# Patient Record
Sex: Female | Born: 1975 | Hispanic: Yes | Marital: Single | Smoking: Never smoker
Health system: Southern US, Community
[De-identification: ages and names within clinical notes are randomized; demographics above are authoritative.]

## PROBLEM LIST (undated history)

## (undated) ENCOUNTER — Inpatient Hospital Stay (HOSPITAL_COMMUNITY): Payer: Self-pay

## (undated) DIAGNOSIS — J45909 Unspecified asthma, uncomplicated: Secondary | ICD-10-CM

## (undated) DIAGNOSIS — E669 Obesity, unspecified: Secondary | ICD-10-CM

## (undated) DIAGNOSIS — N96 Recurrent pregnancy loss: Secondary | ICD-10-CM

## (undated) HISTORY — PX: WISDOM TOOTH EXTRACTION: SHX21

## (undated) HISTORY — PX: CHOLECYSTECTOMY: SHX55

## (undated) HISTORY — DX: Unspecified asthma, uncomplicated: J45.909

---

## 2010-10-17 ENCOUNTER — Emergency Department (HOSPITAL_COMMUNITY): Admission: EM | Admit: 2010-10-17 | Discharge: 2010-10-17 | Payer: Self-pay | Admitting: Emergency Medicine

## 2010-10-19 ENCOUNTER — Inpatient Hospital Stay (HOSPITAL_COMMUNITY): Admission: AD | Admit: 2010-10-19 | Discharge: 2010-10-19 | Payer: Self-pay | Admitting: Obstetrics & Gynecology

## 2010-10-21 ENCOUNTER — Ambulatory Visit (HOSPITAL_COMMUNITY): Admission: RE | Admit: 2010-10-21 | Discharge: 2010-10-21 | Payer: Self-pay | Admitting: Obstetrics

## 2010-10-23 ENCOUNTER — Ambulatory Visit (HOSPITAL_COMMUNITY): Admission: RE | Admit: 2010-10-23 | Discharge: 2010-10-23 | Payer: Self-pay | Admitting: Obstetrics & Gynecology

## 2010-10-25 ENCOUNTER — Ambulatory Visit (HOSPITAL_COMMUNITY): Admission: AD | Admit: 2010-10-25 | Discharge: 2010-10-25 | Payer: Self-pay | Admitting: Family Medicine

## 2010-12-12 ENCOUNTER — Emergency Department (HOSPITAL_COMMUNITY)
Admission: EM | Admit: 2010-12-12 | Discharge: 2010-12-12 | Payer: Self-pay | Source: Home / Self Care | Admitting: Emergency Medicine

## 2011-02-22 LAB — LIPASE, BLOOD: Lipase: 30 U/L (ref 11–59)

## 2011-02-22 LAB — URINALYSIS, ROUTINE W REFLEX MICROSCOPIC
Bilirubin Urine: NEGATIVE
Glucose, UA: NEGATIVE mg/dL
Ketones, ur: NEGATIVE mg/dL
Leukocytes, UA: NEGATIVE
Nitrite: NEGATIVE
Protein, ur: NEGATIVE mg/dL
Specific Gravity, Urine: 1.024 (ref 1.005–1.030)
Urobilinogen, UA: 0.2 mg/dL (ref 0.0–1.0)
pH: 7 (ref 5.0–8.0)

## 2011-02-22 LAB — CBC
HCT: 34.5 % — ABNORMAL LOW (ref 36.0–46.0)
Hemoglobin: 11 g/dL — ABNORMAL LOW (ref 12.0–15.0)
MCH: 26.1 pg (ref 26.0–34.0)
MCHC: 31.9 g/dL (ref 30.0–36.0)
MCV: 81.9 fL (ref 78.0–100.0)
Platelets: 289 10*3/uL (ref 150–400)
RBC: 4.21 MIL/uL (ref 3.87–5.11)
RDW: 13.3 % (ref 11.5–15.5)
WBC: 6.5 10*3/uL (ref 4.0–10.5)

## 2011-02-22 LAB — DIFFERENTIAL
Basophils Absolute: 0 10*3/uL (ref 0.0–0.1)
Basophils Relative: 0 % (ref 0–1)
Eosinophils Absolute: 0.1 10*3/uL (ref 0.0–0.7)
Eosinophils Relative: 1 % (ref 0–5)
Lymphocytes Relative: 22 % (ref 12–46)
Lymphs Abs: 1.5 10*3/uL (ref 0.7–4.0)
Monocytes Absolute: 0.4 10*3/uL (ref 0.1–1.0)
Monocytes Relative: 6 % (ref 3–12)
Neutro Abs: 4.5 10*3/uL (ref 1.7–7.7)
Neutrophils Relative %: 70 % (ref 43–77)

## 2011-02-22 LAB — BASIC METABOLIC PANEL
BUN: 9 mg/dL (ref 6–23)
CO2: 27 mEq/L (ref 19–32)
Calcium: 9 mg/dL (ref 8.4–10.5)
Chloride: 110 mEq/L (ref 96–112)
Creatinine, Ser: 0.68 mg/dL (ref 0.4–1.2)
GFR calc Af Amer: >60 mL/min (ref >60)
GFR calc non Af Amer: >60 mL/min (ref >60)
Glucose, Bld: 96 mg/dL (ref 70–99)
Potassium: 4.1 mEq/L (ref 3.5–5.1)
Sodium: 140 mEq/L (ref 135–145)

## 2011-02-22 LAB — POCT PREGNANCY, URINE: Preg Test, Ur: NEGATIVE

## 2011-02-22 LAB — URINE MICROSCOPIC-ADD ON

## 2011-02-23 LAB — COMPREHENSIVE METABOLIC PANEL
ALT: 29 U/L (ref 0–35)
AST: 23 U/L (ref 0–37)
Albumin: 3.7 g/dL (ref 3.5–5.2)
Alkaline Phosphatase: 67 U/L (ref 39–117)
BUN: 9 mg/dL (ref 6–23)
CO2: 22 mEq/L (ref 19–32)
Calcium: 8.7 mg/dL (ref 8.4–10.5)
Chloride: 111 mEq/L (ref 96–112)
Creatinine, Ser: 0.5 mg/dL (ref 0.4–1.2)
GFR calc Af Amer: >60 mL/min (ref >60)
GFR calc non Af Amer: >60 mL/min (ref >60)
Glucose, Bld: 84 mg/dL (ref 70–99)
Potassium: 3.4 mEq/L — ABNORMAL LOW (ref 3.5–5.1)
Sodium: 137 mEq/L (ref 135–145)
Total Bilirubin: 0.2 mg/dL — ABNORMAL LOW (ref 0.3–1.2)
Total Protein: 6.1 g/dL (ref 6.0–8.3)

## 2011-02-23 LAB — HCG, QUANTITATIVE, PREGNANCY
hCG, Beta Chain, Quant, S: 134 m[IU]/mL — ABNORMAL HIGH (ref <5)
hCG, Beta Chain, Quant, S: 202 m[IU]/mL — ABNORMAL HIGH (ref <5)
hCG, Beta Chain, Quant, S: 254 m[IU]/mL — ABNORMAL HIGH (ref <5)
hCG, Beta Chain, Quant, S: 261 m[IU]/mL — ABNORMAL HIGH (ref <5)
hCG, Beta Chain, Quant, S: 269 m[IU]/mL — ABNORMAL HIGH (ref <5)

## 2011-02-23 LAB — CBC
HCT: 35.1 % — ABNORMAL LOW (ref 36.0–46.0)
HCT: 36.9 % (ref 36.0–46.0)
Hemoglobin: 12.1 g/dL (ref 12.0–15.0)
Hemoglobin: 12.6 g/dL (ref 12.0–15.0)
MCH: 28.4 pg (ref 26.0–34.0)
MCH: 29.2 pg (ref 26.0–34.0)
MCHC: 34.1 g/dL (ref 30.0–36.0)
MCHC: 34.4 g/dL (ref 30.0–36.0)
MCV: 83.3 fL (ref 78.0–100.0)
MCV: 84.8 fL (ref 78.0–100.0)
Platelets: 243 10*3/uL (ref 150–400)
Platelets: 255 10*3/uL (ref 150–400)
RBC: 4.14 MIL/uL (ref 3.87–5.11)
RBC: 4.43 MIL/uL (ref 3.87–5.11)
RDW: 13.8 % (ref 11.5–15.5)
RDW: 13.8 % (ref 11.5–15.5)
WBC: 8.8 10*3/uL (ref 4.0–10.5)
WBC: 9.3 10*3/uL (ref 4.0–10.5)

## 2011-02-23 LAB — DIFFERENTIAL
Basophils Absolute: 0 10*3/uL (ref 0.0–0.1)
Basophils Relative: 0 % (ref 0–1)
Eosinophils Absolute: 0.1 10*3/uL (ref 0.0–0.7)
Eosinophils Relative: 1 % (ref 0–5)
Lymphocytes Relative: 25 % (ref 12–46)
Lymphs Abs: 2.1 10*3/uL (ref 0.7–4.0)
Monocytes Absolute: 0.5 10*3/uL (ref 0.1–1.0)
Monocytes Relative: 6 % (ref 3–12)
Neutro Abs: 6 10*3/uL (ref 1.7–7.7)
Neutrophils Relative %: 68 % (ref 43–77)

## 2011-02-23 LAB — URINE MICROSCOPIC-ADD ON

## 2011-02-23 LAB — ABO/RH: ABO/RH(D): A POS

## 2011-02-23 LAB — LIPASE, BLOOD: Lipase: 27 U/L (ref 11–59)

## 2011-02-23 LAB — URINALYSIS, ROUTINE W REFLEX MICROSCOPIC
Bilirubin Urine: NEGATIVE
Glucose, UA: NEGATIVE mg/dL
Ketones, ur: NEGATIVE mg/dL
Leukocytes, UA: NEGATIVE
Nitrite: NEGATIVE
Protein, ur: NEGATIVE mg/dL
Specific Gravity, Urine: 1.025 (ref 1.005–1.030)
Urobilinogen, UA: 0.2 mg/dL (ref 0.0–1.0)
pH: 7.5 (ref 5.0–8.0)

## 2011-02-23 LAB — POCT PREGNANCY, URINE: Preg Test, Ur: POSITIVE

## 2011-12-14 NOTE — L&D Delivery Note (Signed)
Patient was C/C/+2 and pushed for about 45 minutes with epidural.   NSVD  female infant, Apgars 9,9, weight P.   The patient had one midline episiotomy repaired with 2-0 vicryl R. Fundus was firm. EBL was expected. Placenta was delivered intact. Vagina was clear.  Baby was vigorous to bedside.  Ashana Tullo A

## 2012-01-10 ENCOUNTER — Other Ambulatory Visit: Payer: Self-pay

## 2012-01-10 ENCOUNTER — Encounter (HOSPITAL_COMMUNITY): Payer: Self-pay | Admitting: *Deleted

## 2012-01-10 ENCOUNTER — Emergency Department (HOSPITAL_COMMUNITY)
Admission: EM | Admit: 2012-01-10 | Discharge: 2012-01-10 | Disposition: A | Discharge status: Home / Self Care | Payer: Medicaid Other | Attending: Emergency Medicine | Admitting: Emergency Medicine

## 2012-01-10 DIAGNOSIS — J45901 Unspecified asthma with (acute) exacerbation: Secondary | ICD-10-CM | POA: Insufficient documentation

## 2012-01-10 DIAGNOSIS — R059 Cough, unspecified: Secondary | ICD-10-CM | POA: Insufficient documentation

## 2012-01-10 DIAGNOSIS — E669 Obesity, unspecified: Secondary | ICD-10-CM | POA: Insufficient documentation

## 2012-01-10 DIAGNOSIS — R05 Cough: Secondary | ICD-10-CM | POA: Insufficient documentation

## 2012-01-10 DIAGNOSIS — Z331 Pregnant state, incidental: Secondary | ICD-10-CM | POA: Insufficient documentation

## 2012-01-10 DIAGNOSIS — R0602 Shortness of breath: Secondary | ICD-10-CM | POA: Insufficient documentation

## 2012-01-10 LAB — PREGNANCY, URINE: Preg Test, Ur: POSITIVE — AB

## 2012-01-10 MED ORDER — IPRATROPIUM BROMIDE 0.02 % IN SOLN
0.5000 mg | Freq: Once | RESPIRATORY_TRACT | Status: AC
  Administered 2012-01-10: 0.5 mg via RESPIRATORY_TRACT
  Filled 2012-01-10: qty 2.5

## 2012-01-10 MED ORDER — ALBUTEROL SULFATE (5 MG/ML) 0.5% IN NEBU
5.0000 mg | INHALATION_SOLUTION | Freq: Once | RESPIRATORY_TRACT | Status: AC
  Administered 2012-01-10: 5 mg via RESPIRATORY_TRACT
  Filled 2012-01-10: qty 1

## 2012-01-10 MED ORDER — ALBUTEROL SULFATE HFA 108 (90 BASE) MCG/ACT IN AERS: 1.0000 | INHALATION_SPRAY | Freq: Four times a day (QID) | RESPIRATORY_TRACT | Status: DC | PRN

## 2012-01-10 NOTE — ED Notes (Signed)
Cold cough chest congestion for 4 days with some diff breathing.  None now

## 2012-01-10 NOTE — ED Notes (Signed)
Pt states that she is having difficulty breathing. States that she hears crackling when breathing. Onset 3 days ago. Unable to sleep on back. Has to lay on L side.

## 2012-01-13 ENCOUNTER — Inpatient Hospital Stay (HOSPITAL_COMMUNITY): Payer: Medicaid Other

## 2012-01-13 ENCOUNTER — Inpatient Hospital Stay (HOSPITAL_COMMUNITY)
Admission: AD | Admit: 2012-01-13 | Discharge: 2012-01-13 | Disposition: A | Discharge status: Home / Self Care | Payer: Medicaid Other | Source: Ambulatory Visit | Attending: Obstetrics and Gynecology | Admitting: Obstetrics and Gynecology

## 2012-01-13 ENCOUNTER — Encounter (HOSPITAL_COMMUNITY): Payer: Self-pay

## 2012-01-13 DIAGNOSIS — O209 Hemorrhage in early pregnancy, unspecified: Secondary | ICD-10-CM

## 2012-01-13 DIAGNOSIS — O418X90 Other specified disorders of amniotic fluid and membranes, unspecified trimester, not applicable or unspecified: Secondary | ICD-10-CM

## 2012-01-13 DIAGNOSIS — O468X9 Other antepartum hemorrhage, unspecified trimester: Secondary | ICD-10-CM

## 2012-01-13 LAB — DIFFERENTIAL
Basophils Absolute: 0 10*3/uL (ref 0.0–0.1)
Basophils Relative: 0 % (ref 0–1)
Eosinophils Absolute: 0.1 10*3/uL (ref 0.0–0.7)
Eosinophils Relative: 1 % (ref 0–5)
Lymphocytes Relative: 22 % (ref 12–46)
Lymphs Abs: 2.3 10*3/uL (ref 0.7–4.0)
Monocytes Absolute: 0.7 10*3/uL (ref 0.1–1.0)
Monocytes Relative: 6 % (ref 3–12)
Neutro Abs: 7.4 10*3/uL (ref 1.7–7.7)
Neutrophils Relative %: 70 % (ref 43–77)

## 2012-01-13 LAB — URINALYSIS, ROUTINE W REFLEX MICROSCOPIC
Bilirubin Urine: NEGATIVE
Glucose, UA: NEGATIVE mg/dL
Hgb urine dipstick: NEGATIVE
Ketones, ur: NEGATIVE mg/dL
Leukocytes, UA: NEGATIVE
Nitrite: NEGATIVE
Protein, ur: NEGATIVE mg/dL
Specific Gravity, Urine: 1.02 (ref 1.005–1.030)
Urobilinogen, UA: 0.2 mg/dL (ref 0.0–1.0)
pH: 6.5 (ref 5.0–8.0)

## 2012-01-13 LAB — CBC
HCT: 37.2 % (ref 36.0–46.0)
Hemoglobin: 12.5 g/dL (ref 12.0–15.0)
MCH: 28.1 pg (ref 26.0–34.0)
MCHC: 33.6 g/dL (ref 30.0–36.0)
MCV: 83.6 fL (ref 78.0–100.0)
Platelets: 268 10*3/uL (ref 150–400)
RBC: 4.45 MIL/uL (ref 3.87–5.11)
RDW: 14 % (ref 11.5–15.5)
WBC: 10.5 10*3/uL (ref 4.0–10.5)

## 2012-01-13 LAB — ABO/RH: ABO/RH(D): A POS

## 2012-01-13 LAB — WET PREP, GENITAL
Clue Cells Wet Prep HPF POC: NONE SEEN
Trich, Wet Prep: NONE SEEN
Yeast Wet Prep HPF POC: NONE SEEN

## 2012-01-13 NOTE — Progress Notes (Signed)
Patient is concerned with knowing the fact she had a positive pregnancy test at [facility] 2 days ago. 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 She denies any pain or vaginal bleeding presently. She states that she has a frothy non-odorous vaginal bleeding.

## 2012-01-13 NOTE — ED Provider Notes (Signed)
History     CSN: [id number]  Arrival date & time 01/13/12  1525   None     No chief complaint on file.  HPI Catherine Edwards is a 36 y.o. female @ [redacted]w[redacted]d gestation who presents to [facility] for spotting in early pregnancy. She found out she was pregnant when she went to the ED a couple days ago for an asthma attack. She is concerned about the spotting that she has had because all of her previous  Pregnancies have ended in miscarriages. The history was provided by the patient.  Past Medical History  Diagnosis Date  . Asthma     Past Surgical History  Procedure Date  . Cholecystectomy     History reviewed. No pertinent family history.  History  Substance Use Topics  . Smoking status: Never Smoker   . Smokeless tobacco: Not on file  . Alcohol Use: 0.6 oz/week    1 Shots of liquor per week    OB History    Grav Para Term Preterm Abortions TAB SAB Ect Mult Living   7    6  6          Review of Systems  Constitutional: Negative for fever, chills, diaphoresis and fatigue.  HENT: Negative for ear pain, congestion, sore throat, facial swelling, neck pain, neck stiffness, dental problem and sinus pressure.   Eyes: Negative for photophobia, pain and discharge.  Respiratory: Negative for cough, chest tightness and wheezing.   Gastrointestinal: Negative for nausea, vomiting, abdominal pain, diarrhea, constipation and abdominal distention.  Genitourinary: Negative for dysuria, frequency, flank pain and difficulty urinating.  Musculoskeletal: Negative for myalgias, back pain and gait problem.  Skin: Negative for color change and rash.  Neurological: Negative for dizziness, speech difficulty, weakness, light-headedness, numbness and headaches.  Psychiatric/Behavioral: Negative for confusion and agitation.    Allergies  Review of patient's allergies indicates no known allergies.  Home Medications  No current outpatient prescriptions on file.  BP 124/69  Pulse 88  Temp(Src) 99 F  (37.2 C) (Oral)  Resp 18  Ht 5\' 5"  (1.651 m)  Wt 238 lb 12.8 oz (108.319 kg)  BMI 39.74 kg/m2  SpO2 99%  LMP 11/02/2011  Physical Exam  Nursing note and vitals reviewed. Constitutional: She is oriented to person, place, and time. She appears well-developed and well-nourished.  HENT:  Head: Normocephalic.  Eyes: EOM are normal.  Neck: Neck supple.  Cardiovascular: Normal rate.   Pulmonary/Chest: Effort normal.  Abdominal: Soft. There is no tenderness.  Genitourinary:       External genitalia without lesions. No bleeding noted, white discharge in the vaginal vault. Cervix closed, long, no CMT, no adnexal tenderness or mass palpated.  Musculoskeletal: Normal range of motion.  Neurological: She is alert and oriented to person, place, and time. No cranial nerve deficit.  Skin: Skin is warm and dry.  Psychiatric: She has a normal mood and affect. Her behavior is normal. Judgment and thought content normal.   Results for orders placed during the hospital encounter of 01/13/12 (from the past 24 hour(s))  WET PREP, GENITAL     Status: Abnormal   Collection Time   01/13/12  5:45 PM      Component Value Range   Yeast Wet Prep HPF POC NONE SEEN  NONE SEEN    Trich, Wet Prep NONE SEEN  NONE SEEN    Clue Cells Wet Prep HPF POC NONE SEEN  NONE SEEN    WBC, Wet Prep HPF POC FEW (*) NONE  SEEN   CBC     Status: Normal   Collection Time   01/13/12  6:05 PM      Component Value Range   WBC 10.5  4.0 - 10.5 (K/uL)   RBC 4.45  3.87 - 5.11 (MIL/uL)   Hemoglobin 12.5  12.0 - 15.0 (g/dL)   HCT 09.8  11.9 - 14.7 (%)   MCV 83.6  78.0 - 100.0 (fL)   MCH 28.1  26.0 - 34.0 (pg)   MCHC 33.6  30.0 - 36.0 (g/dL)   RDW 82.9  56.2 - 13.0 (%)   Platelets 268  150 - 400 (K/uL)  DIFFERENTIAL     Status: Normal   Collection Time   01/13/12  6:05 PM      Component Value Range   Neutrophils Relative 70  43 - 77 (%)   Neutro Abs 7.4  1.7 - 7.7 (K/uL)   Lymphocytes Relative 22  12 - 46 (%)   Lymphs Abs 2.3   0.7 - 4.0 (K/uL)   Monocytes Relative 6  3 - 12 (%)   Monocytes Absolute 0.7  0.1 - 1.0 (K/uL)   Eosinophils Relative 1  0 - 5 (%)   Eosinophils Absolute 0.1  0.0 - 0.7 (K/uL)   Basophils Relative 0  0 - 1 (%)   Basophils Absolute 0.0  0.0 - 0.1 (K/uL)  ABO/RH     Status: Normal   Collection Time   01/13/12  6:05 PM      Component Value Range   ABO/RH(D) A POS     US Ob Comp Less 14 Wks  01/13/2012  *RADIOLOGY REPORT*  Clinical Data: Spotting, pregnant  OBSTETRIC <14 WK ULTRASOUND  Technique:  Transabdominal ultrasound was performed for evaluation of the gestation as well as the maternal uterus and adnexal regions.  Comparison:  None.  Intrauterine gestational sac: Single Yolk sac: Visualized Embryo: Visualized Cardiac Activity: Present Heart Rate: 174 bpm  MSD:  mm  w  d CRL:  24 mm  9w  1d           [place] EDC: 08/16/2012  Maternal uterus/Adnexae: There is a small amount of subchorionic hemorrhage.  Left ovary 20 x 21 x 30 3 mm, unremarkable.  Right ovary 26 x 27 x 38 mm.  There is no free fluid.  IMPRESSION: 1.  9-week-1-day live intrauterine gestation. 2.  Small amount of subchorionic hemorrhage.  Original Report Authenticated By: Osa Craver, M.D.   Assessment: Bleeding in first trimester pregnancy  Plan:  Instructions to patient regarding threatened AB   Start prenatal vitamins and prenatal care   Return here as needed.   Patient voices understanding   ED Course  Procedures          [place], [place] 01/14/12 [phone]

## 2012-01-13 NOTE — Progress Notes (Signed)
Patient states she has a history of 6 SAB's with no births. Had a positive pregnancy test at [facility] ED on 1-28. At that time she was having a little spotting but is not having any at this time. Denies any pain. Because of her history she wants to have everything checked out.

## 2012-01-14 LAB — GC/CHLAMYDIA PROBE AMP, GENITAL
Chlamydia, DNA Probe: NEGATIVE
GC Probe Amp, Genital: NEGATIVE

## 2012-01-19 IMAGING — US US OB TRANSVAGINAL MODIFY
1 series · 14 of 28 positions shown · non-contrast
Comparison: None.

CLINICAL DATA: Abdominal pain.  Spotting.  LMP 4-5 weeks ago.

OBSTETRIC <14 WK US AND TRANSVAGINAL OB US
TECHNIQUE: Both transabdominal and transvaginal ultrasound
examinations were performed for complete evaluation of the
gestation as well as the maternal uterus, adnexal regions, and
pelvic cul-de-sac.  Transvaginal technique was performed to assess
early pregnancy.

[Series 1: us ob transvaginal modify · 0.28mm/px · 14 of 54 slices shown]
[im 2/54]
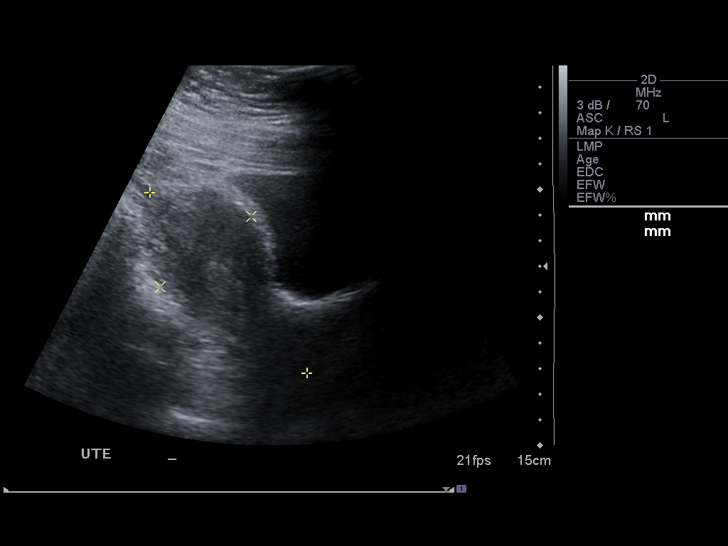
[im 6/54]
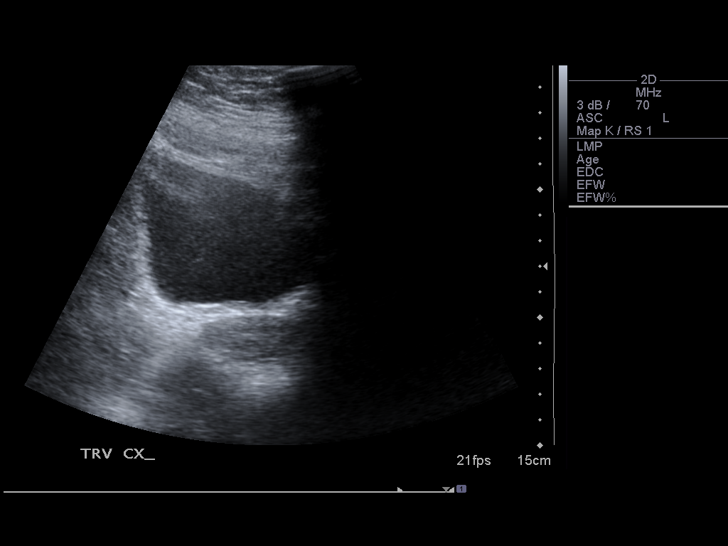
[im 10/54]
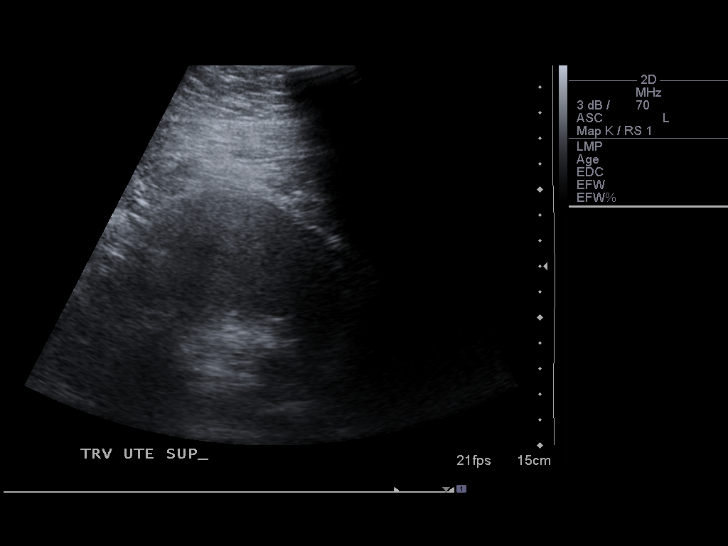
[im 14/54]
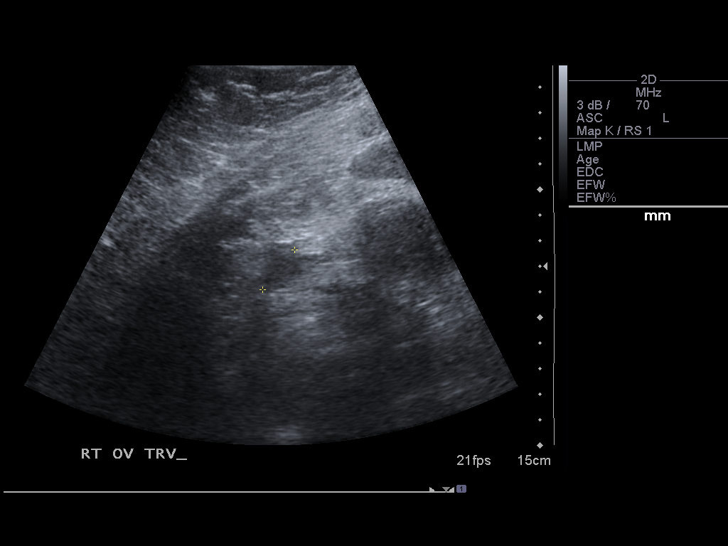
[im 18/54]
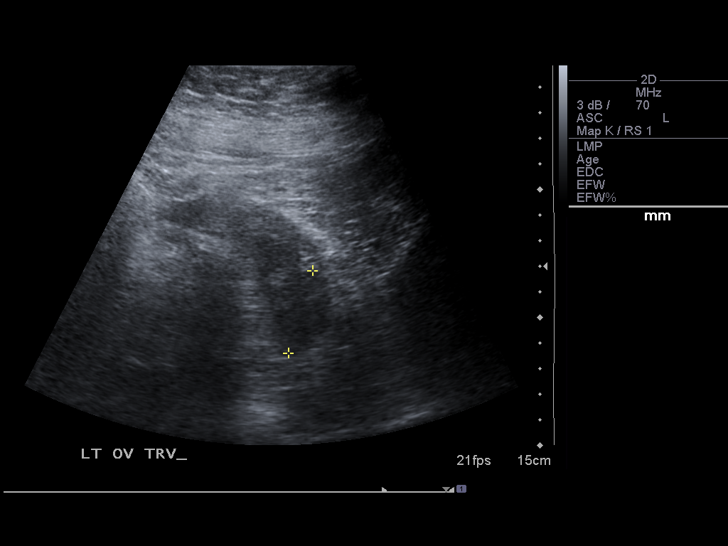
[im 22/54]
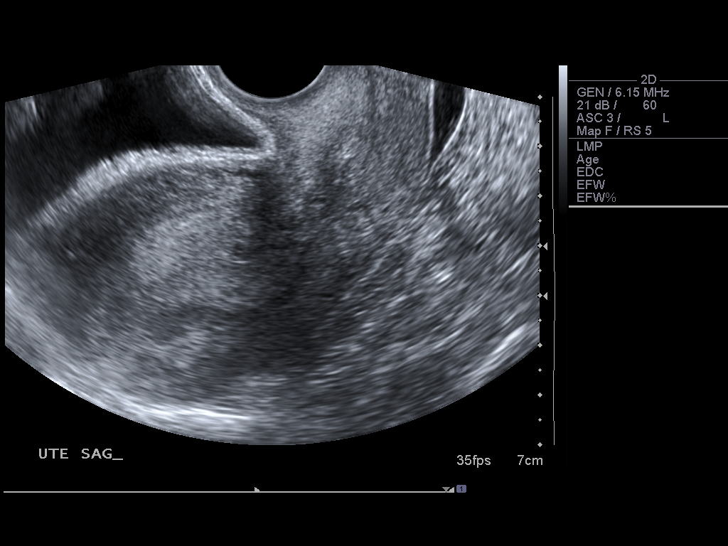
[im 26/54]
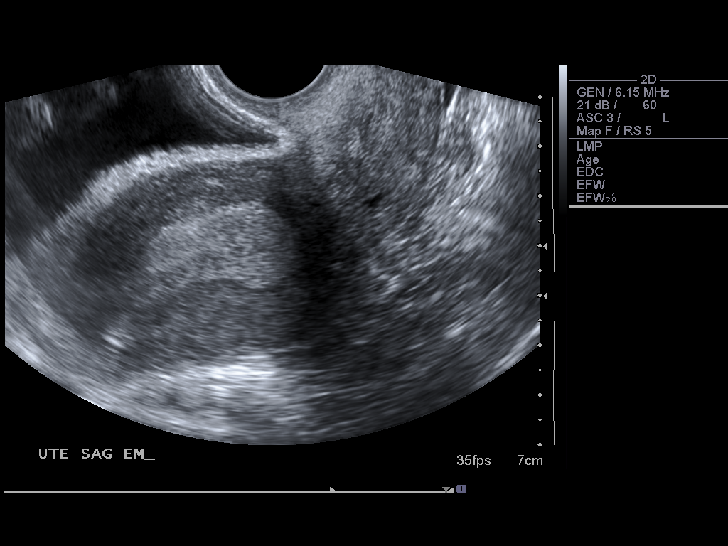
[im 30/54]
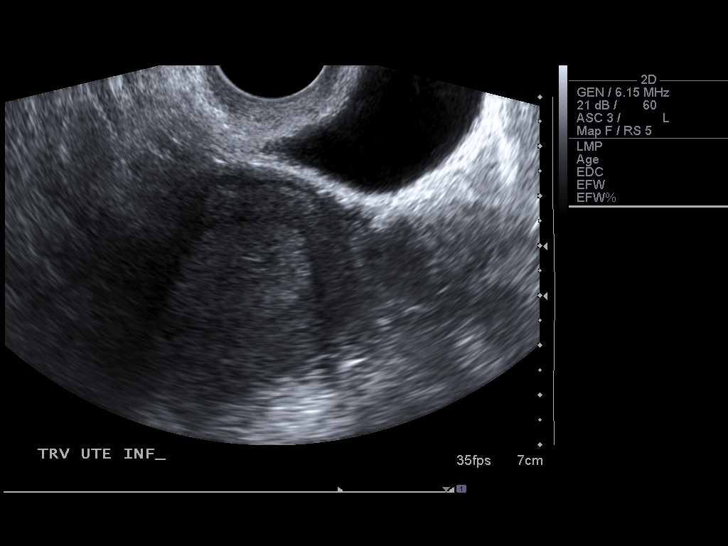
[im 34/54]
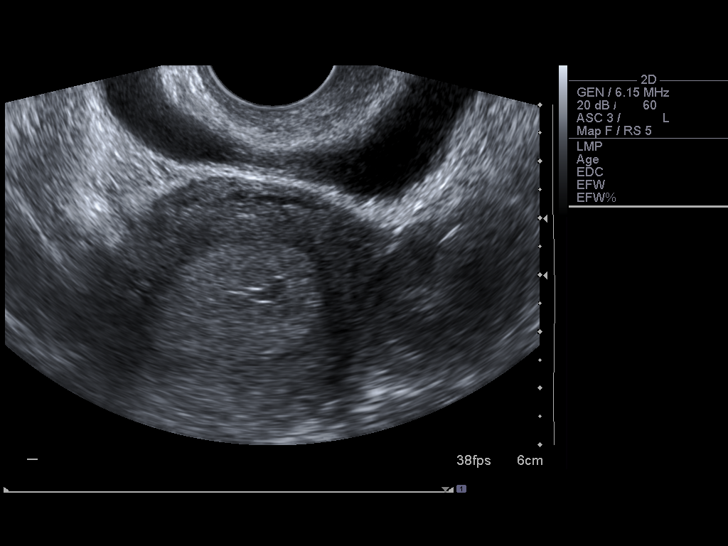
[im 38/54]
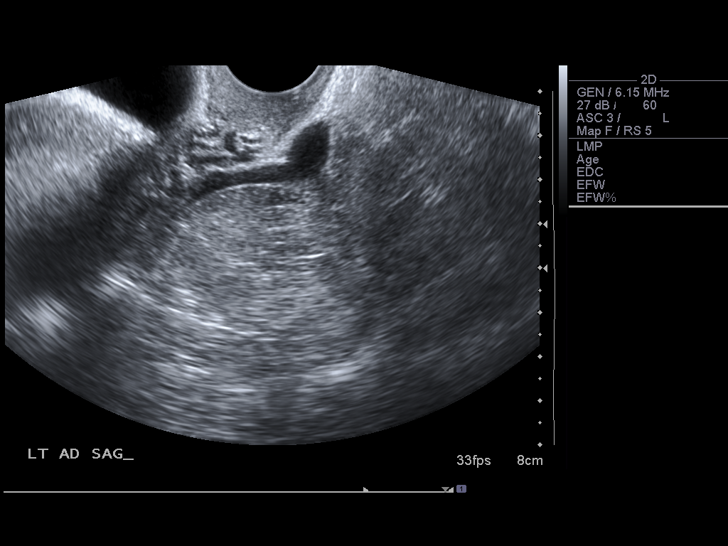
[im 42/54]
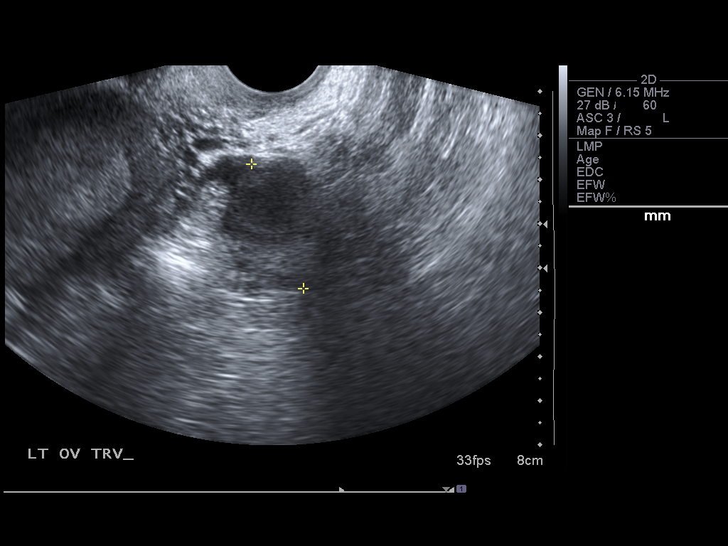
[im 46/54]
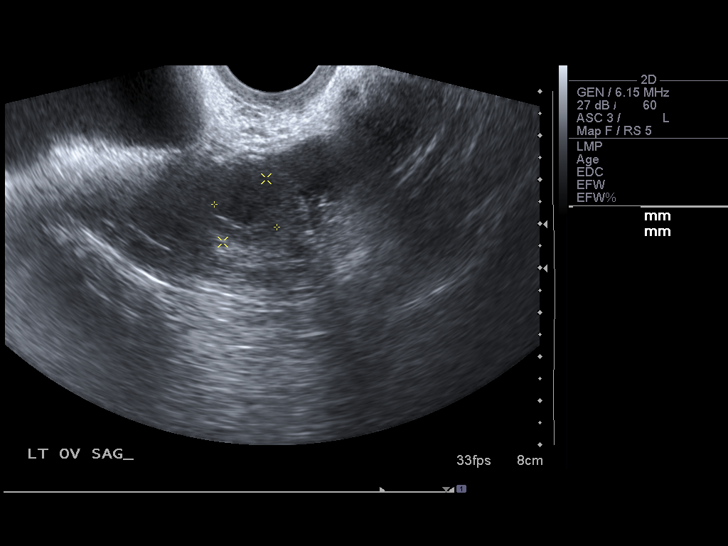
[im 50/54]
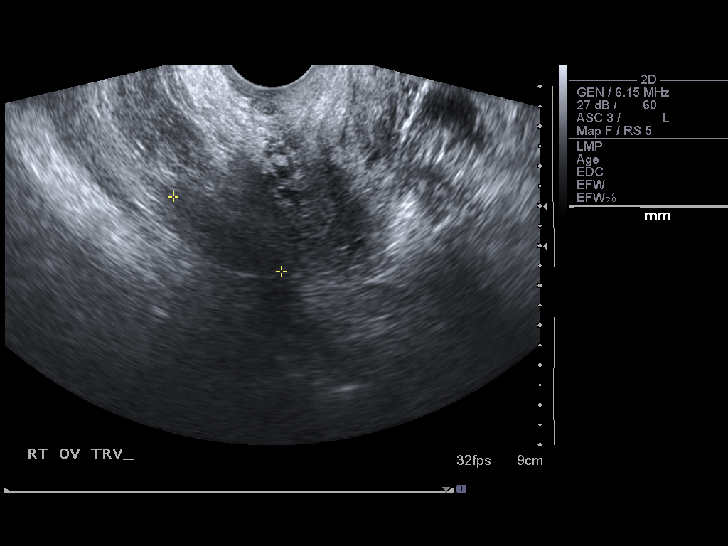
[im 54/54]
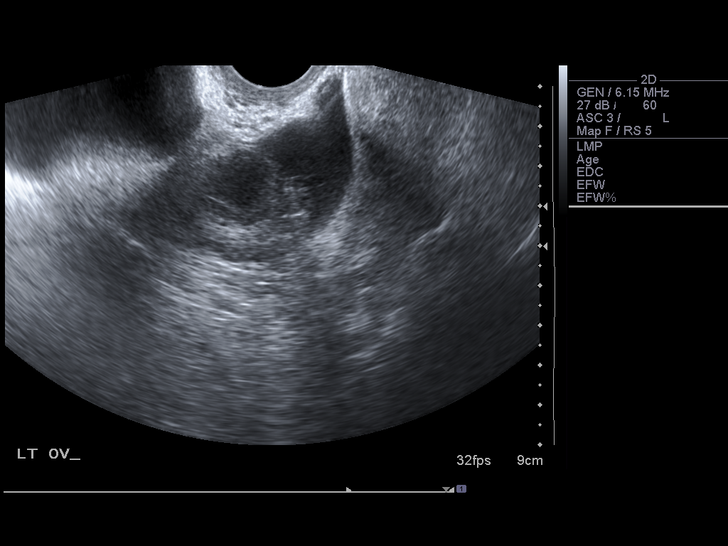

[14 of 28 positions shown; findings below may reference images not displayed]

Intrauterine gestational sac:  None
Yolk sac: None
Embryo: None
Cardiac Activity: None

Maternal uterus/adnexae:
The endometrium is thickened, measuring 2.1 cm.  The ovaries have a
normal appearance.  Possible corpus luteum cyst is seen on the
left, measuring 1.8 x 1.5 x 1.7 cm.  There is a trace of free
pelvic fluid.
IMPRESSION: 1.  No evidence for intrauterine or adnexal pregnancy.  Possible
decidual reaction of the uterus.
2.  Correlation with quantitative  beta HCG is recommended.  Follow-
up ultrasound is recommended in 10-14 days to document location of
pregnancy and for dating purposes.

## 2012-01-19 NOTE — ED Provider Notes (Signed)
Agree with above note.  Catherine Edwards 01/19/2012 8:30 AM

## 2012-01-21 NOTE — ED Provider Notes (Signed)
History    36 year old female with cough. Gradual onset about 3 days ago. Shortness of breath. Patient states she has a history of asthma and this feels similar to previous. No fever or chills. Denies history of blood clot. No unusual leg pain or swelling. No sick contacts. Feels congested. Denies chest pain. Symptoms are worse when she is supine.  CSN: [id number]  Arrival date & time 01/10/12  1622   First MD Initiated Contact with Patient 01/10/12 1731      Chief Complaint  Patient presents with  . Cough    (Consider location/radiation/quality/duration/timing/severity/associated sxs/prior treatment) HPI  Past Medical History  Diagnosis Date  . Asthma     Past Surgical History  Procedure Date  . Cholecystectomy     History reviewed. No pertinent family history.  History  Substance Use Topics  . Smoking status: Never Smoker   . Smokeless tobacco: Not on file  . Alcohol Use: 0.6 oz/week    1 Shots of liquor per week    OB History    Grav Para Term Preterm Abortions TAB SAB Ect Mult Living   7    6  6          Review of Systems   Review of symptoms negative unless otherwise noted in HPI.   Allergies  Review of patient's allergies indicates no known allergies.  Home Medications   Current Outpatient Rx  Name Route Sig Dispense Refill  . ACETAMINOPHEN 325 MG PO TABS Oral Take 975 mg by mouth daily as needed. For pain/headache    . ALBUTEROL SULFATE HFA 108 (90 BASE) MCG/ACT IN AERS Inhalation Inhale 1-2 puffs into the lungs every 6 (six) hours as needed. For athsma    . VICKS VAPORUB EX Apply externally Apply 1 application topically 2 (two) times daily as needed. For cough/congestion    . PRENATAL MULTIVITAMIN CH Oral Take 1 tablet by mouth daily.      BP 115/71  Pulse 97  Temp(Src) 98.3 F (36.8 C) (Oral)  Resp 15  SpO2 97%  LMP 12/10/2011  Physical Exam  Nursing note and vitals reviewed. Constitutional: No distress.       Sitting up in bed. No  acute distress. Obese  HENT:  Head: Normocephalic and atraumatic.  Eyes: Conjunctivae are normal. Right eye exhibits no discharge. Left eye exhibits no discharge.  Neck: Neck supple.  Cardiovascular: Normal rate, regular rhythm and normal heart sounds.  Exam reveals no gallop and no friction rub.   No murmur heard. Pulmonary/Chest: Effort normal. No respiratory distress. She has wheezes.       Faint expiratory wheezing bilaterally.no accessory muscle usage. No tachypnea.  Abdominal: Soft. She exhibits no distension. There is no tenderness.  Musculoskeletal: She exhibits no edema and no tenderness.       No lower extremity edema. No calf tenderness. Negative Homan sign  Neurological: She is alert.  Skin: Skin is warm and dry.  Psychiatric: She has a normal mood and affect. Her behavior is normal. Thought content normal.    ED Course  Procedures (including critical care time)  Labs Reviewed  PREGNANCY, URINE - Abnormal; Notable for the following:    Preg Test, Ur POSITIVE (*)    All other components within normal limits  LAB REPORT - SCANNED   No results found.   1. Asthma exacerbation   2. Pregnancy, incidental       MDM  36 year old female with cough. History of asthma and says symptoms are very solid  a previous. Mild wheezing on exam but no respiratory distress. Symptoms improved after treatment. Incidental pregnancy noted.outpatient followup as needed for respiratory symptoms and also for obstetrical care. Return precautions discussed.        Raeford Razor, MD 01/21/12 [phone]

## 2012-02-10 ENCOUNTER — Encounter (HOSPITAL_COMMUNITY): Payer: Self-pay | Admitting: *Deleted

## 2012-02-10 ENCOUNTER — Inpatient Hospital Stay (HOSPITAL_COMMUNITY)
Admission: AD | Admit: 2012-02-10 | Discharge: 2012-02-10 | Disposition: A | Discharge status: Home / Self Care | Payer: Medicaid Other | Source: Ambulatory Visit | Attending: Obstetrics & Gynecology | Admitting: Obstetrics & Gynecology

## 2012-02-10 DIAGNOSIS — H109 Unspecified conjunctivitis: Secondary | ICD-10-CM

## 2012-02-10 DIAGNOSIS — O99891 Other specified diseases and conditions complicating pregnancy: Secondary | ICD-10-CM | POA: Insufficient documentation

## 2012-02-10 DIAGNOSIS — R197 Diarrhea, unspecified: Secondary | ICD-10-CM | POA: Insufficient documentation

## 2012-02-10 DIAGNOSIS — J40 Bronchitis, not specified as acute or chronic: Secondary | ICD-10-CM

## 2012-02-10 DIAGNOSIS — E876 Hypokalemia: Secondary | ICD-10-CM

## 2012-02-10 DIAGNOSIS — R112 Nausea with vomiting, unspecified: Secondary | ICD-10-CM

## 2012-02-10 DIAGNOSIS — O210 Mild hyperemesis gravidarum: Secondary | ICD-10-CM | POA: Insufficient documentation

## 2012-02-10 LAB — URINALYSIS, ROUTINE W REFLEX MICROSCOPIC
Bilirubin Urine: NEGATIVE
Glucose, UA: NEGATIVE mg/dL
Ketones, ur: 15 mg/dL — AB
Leukocytes, UA: NEGATIVE
Nitrite: NEGATIVE
Protein, ur: NEGATIVE mg/dL
Specific Gravity, Urine: 1.025 (ref 1.005–1.030)
Urobilinogen, UA: 1 mg/dL (ref 0.0–1.0)
pH: 6 (ref 5.0–8.0)

## 2012-02-10 LAB — CBC
HCT: 36.7 % (ref 36.0–46.0)
Hemoglobin: 12.4 g/dL (ref 12.0–15.0)
MCH: 27.4 pg (ref 26.0–34.0)
MCHC: 33.8 g/dL (ref 30.0–36.0)
MCV: 81.2 fL (ref 78.0–100.0)
Platelets: 283 10*3/uL (ref 150–400)
RBC: 4.52 MIL/uL (ref 3.87–5.11)
RDW: 13.8 % (ref 11.5–15.5)
WBC: 11.3 10*3/uL — ABNORMAL HIGH (ref 4.0–10.5)

## 2012-02-10 LAB — URINE MICROSCOPIC-ADD ON

## 2012-02-10 LAB — BASIC METABOLIC PANEL
BUN: 8 mg/dL (ref 6–23)
CO2: 24 mEq/L (ref 19–32)
Calcium: 8.6 mg/dL (ref 8.4–10.5)
Chloride: 101 mEq/L (ref 96–112)
Creatinine, Ser: 0.47 mg/dL — ABNORMAL LOW (ref 0.50–1.10)
GFR calc Af Amer: >90 mL/min (ref >90)
GFR calc non Af Amer: >90 mL/min (ref >90)
Glucose, Bld: 92 mg/dL (ref 70–99)
Potassium: 2.9 mEq/L — ABNORMAL LOW (ref 3.5–5.1)
Sodium: 134 mEq/L — ABNORMAL LOW (ref 135–145)

## 2012-02-10 MED ORDER — AZITHROMYCIN 250 MG PO TABS
500.0000 mg | ORAL_TABLET | Freq: Once | ORAL | Status: AC
  Administered 2012-02-10: 500 mg via ORAL
  Filled 2012-02-10: qty 2

## 2012-02-10 MED ORDER — ONDANSETRON 8 MG PO TBDP: 8.0000 mg | ORAL_TABLET | Freq: Three times a day (TID) | ORAL | Status: AC | PRN

## 2012-02-10 MED ORDER — ERYTHROMYCIN 5 MG/GM OP OINT
TOPICAL_OINTMENT | Freq: Four times a day (QID) | OPHTHALMIC | Status: DC
  Administered 2012-02-10: 1 via OPHTHALMIC
  Filled 2012-02-10: qty 1

## 2012-02-10 MED ORDER — GUAIFENESIN-CODEINE 100-10 MG/5ML PO SOLN
10.0000 mL | Freq: Once | ORAL | Status: AC
  Administered 2012-02-10: 10 mL via ORAL
  Filled 2012-02-10: qty 10

## 2012-02-10 MED ORDER — LACTATED RINGERS IV BOLUS (SEPSIS)
1000.0000 mL | Freq: Once | INTRAVENOUS | Status: AC
  Administered 2012-02-10: 1000 mL via INTRAVENOUS

## 2012-02-10 MED ORDER — GUAIFENESIN-CODEINE 100-10 MG/5ML PO SYRP: 5.0000 mL | ORAL_SOLUTION | Freq: Three times a day (TID) | ORAL | Status: AC | PRN

## 2012-02-10 MED ORDER — POTASSIUM CHLORIDE CRYS ER 20 MEQ PO TBCR
20.0000 meq | EXTENDED_RELEASE_TABLET | Freq: Once | ORAL | Status: AC
  Administered 2012-02-10: 20 meq via ORAL
  Filled 2012-02-10: qty 1

## 2012-02-10 MED ORDER — AZITHROMYCIN 250 MG PO TABS: ORAL_TABLET | ORAL | Status: DC

## 2012-02-10 MED ORDER — ONDANSETRON 8 MG PO TBDP
8.0000 mg | ORAL_TABLET | Freq: Once | ORAL | Status: AC
  Administered 2012-02-10: 8 mg via ORAL
  Filled 2012-02-10: qty 1

## 2012-02-10 MED ORDER — AZITHROMYCIN 250 MG PO TABS: ORAL_TABLET | ORAL | Status: AC

## 2012-02-10 NOTE — Discharge Instructions (Signed)
Bronchitis Bronchitis is the body's way of reacting to injury and/or infection (inflammation) of the bronchi. Bronchi are the air tubes that extend from the windpipe into the lungs. If the inflammation becomes severe, it may cause shortness of breath. CAUSES  Inflammation may be caused by:  A virus.   Germs (bacteria).   Dust.   Allergens.   Pollutants and many other irritants.  The cells lining the bronchial tree are covered with tiny hairs (cilia). These constantly beat upward, away from the lungs, toward the mouth. This keeps the lungs free of pollutants. When these cells become too irritated and are unable to do their job, mucus begins to develop. This causes the characteristic cough of bronchitis. The cough clears the lungs when the cilia are unable to do their job. Without either of these protective mechanisms, the mucus would settle in the lungs. Then you would develop pneumonia. Smoking is a common cause of bronchitis and can contribute to pneumonia. Stopping this habit is the single most important thing you can do to help yourself. TREATMENT   Your caregiver may prescribe an antibiotic if the cough is caused by bacteria. Also, medicines that open up your airways make it easier to breathe. Your caregiver may also recommend or prescribe an expectorant. It will loosen the mucus to be coughed up. Only take over-the-counter or prescription medicines for pain, discomfort, or fever as directed by your caregiver.   Removing whatever causes the problem (smoking, for example) is critical to preventing the problem from getting worse.   Cough suppressants may be prescribed for relief of cough symptoms.   Inhaled medicines may be prescribed to help with symptoms now and to help prevent problems from returning.   For those with recurrent (chronic) bronchitis, there may be a need for steroid medicines.  SEEK IMMEDIATE MEDICAL CARE IF:   During treatment, you develop more pus-like mucus  (purulent sputum).   You have a fever.   Your baby is older than 3 months with a rectal temperature of 102 F (38.9 C) or higher.   Your baby is 26 months old or younger with a rectal temperature of 100.4 F (38 C) or higher.   You become progressively more ill.   You have increased difficulty breathing, wheezing, or shortness of breath.  It is necessary to seek immediate medical care if you are elderly or sick from any other disease. MAKE SURE YOU:   Understand these instructions.   Will watch your condition.   Will get help right away if you are not doing well or get worse.  Document Released: 11/29/2005 Document Revised: 08/11/2011 Document Reviewed: 10/08/2008 [facility] Patient Information 2012 [place], [place].Conjunctivitis Conjunctivitis is commonly called "pink eye." Conjunctivitis can be caused by bacterial or viral infection, allergies, or injuries. There is usually redness of the lining of the eye, itching, discomfort, and sometimes discharge. There may be deposits of matter along the eyelids. A viral infection usually causes a watery discharge, while a bacterial infection causes a yellowish, thick discharge. Pink eye is very contagious and spreads by direct contact. You may be given antibiotic eyedrops as part of your treatment. Before using your eye medicine, remove all drainage from the eye by washing gently with warm water and cotton balls. Continue to use the medication until you have awakened 2 mornings in a row without discharge from the eye. Do not rub your eye. This increases the irritation and helps spread infection. Use separate towels from other household members. Wash your hands  with soap and water before and after touching your eyes. Use cold compresses to reduce pain and sunglasses to relieve irritation from light. Do not wear contact lenses or wear eye makeup until the infection is gone. SEEK MEDICAL CARE IF:   Your symptoms are not better after 3 days of treatment.    You have increased pain or trouble seeing.   The outer eyelids become very red or swollen.  Document Released: 01/06/2005 Document Revised: 08/11/2011 Document Reviewed: 11/29/2005 [facility] Patient Information 2012 [place], [place].B.R.A.T. Diet Your doctor has recommended the B.R.A.T. diet for you or your child until the condition improves. This is often used to help control diarrhea and vomiting symptoms. If you or your child can tolerate clear liquids, you may have:  Bananas.   Rice.   Applesauce.   Toast (and other simple starches such as crackers, potatoes, noodles).  Be sure to avoid dairy products, meats, and fatty foods until symptoms are better. Fruit juices such as apple, grape, and prune juice can make diarrhea worse. Avoid these. Continue this diet for 2 days or as instructed by your caregiver. Document Released: 11/29/2005 Document Revised: 08/11/2011 Document Reviewed: 05/18/2007 [facility] Patient Information 2012 [place], [place].Clear Liquid Diet The clear liquid dietconsists of foods that are liquid or will become liquid at room temperature.You should be able to see through the liquid and beverages. Examples of foods allowed on a clear liquid diet include fruit juice, broth or bouillon, gelatin, or frozen ice pops. The purpose of this diet is to provide necessary fluid, electrolytes such a sodium and potassium, and energy to keep the body functioning during times when you are not able to consume a regular diet.A clear liquid diet should not be continued for long periods of time as it is not nutritionally adequate.  REASONS FOR USING A CLEAR LIQUID DIET  In sudden onset (acute) conditions for a patient before or after surgery.   As the first step in oral feeding.   For fluid and electrolyte replacement in diarrheal diseases.   As a diet before certain medical tests are performed.  ADEQUACY The clear liquid diet is adequate only in ascorbic acid, according to the  Recommended Dietary Allowances of the [employer]. CHOOSING FOODS Breads and Starches  Allowed:  None are allowed.   Avoid: All are avoided.  Vegetables  Allowed:  Strained tomato or vegetable juice.   Avoid: Any others.  Fruit  Allowed:  Strained fruit juices and fruit drinks. Include 1 serving of citrus or vitamin C-enriched fruit juice daily.   Avoid: Any others.  Meat and Meat Substitutes  Allowed:  None are allowed.   Avoid: All are avoided.  Milk  Allowed:  None are allowed.   Avoid: All are avoided.  Soups and Combination Foods  Allowed:  Clear bouillon, broth, or strained broth-based soups.   Avoid: Any others.  Desserts and Sweets  Allowed:  Sugar, honey. High protein gelatin. Flavored gelatin, ices, or frozen ice pops that do not contain milk.   Avoid: Any others.  Fats and Oils  Allowed:  None are allowed.   Avoid: All are avoided.  Beverages  Allowed:  Carbonated beverages, cereal beverages, coffee (regular or decaffeinated), or tea.   Avoid: Any others.  Condiments  Allowed:  Iodized salt.   Avoid: Any others, including pepper.  Supplements  Allowed:  Liquid nutrition beverages.   Avoid: Any others that contain lactose or fiber.  SAMPLE MEAL PLAN Breakfast  4 oz strained orange juice.  to 1 cup gelatin (plain or fortified).   1 cup beverage (coffee or tea).   Sugar, if desired.  Midmorning Snack   cup gelatin (plain or fortified).  Lunch  1 cup broth or consomm.   4 oz strained grapefruit juice.    cup gelatin (plain or fortified).   1 cup beverage (coffee or tea).   Sugar, if desired.  Midafternoon Snack   cup fruit ice.    cup strained fruit juice.  Dinner  1 cup broth or consomm.    cup cranberry juice.    cup flavored gelatin (plain or fortified).   1 cup beverage (coffee or tea).   Sugar, if desired.  Evening Snack  4 oz strained apple juice (vitamin C-fortified).    cup  flavored gelatin (plain or fortified).  Document Released: 11/29/2005 Document Revised: 08/11/2011 Document Reviewed: 02/26/2011 [facility] Patient Information 2012 [place], [place].Foods Rich in Potassium The body needs potassium to:  Control blood pressure.   Keep the muscles healthy.   Keep the nervous system healthy.  Most foods contain potassium. Eating a variety of foods in the right amounts will help control the level of potassium in your body.  Food / Potassium (mg)  Apricots, dried,  cup / 378 mg   Apricots, raw, 1 cup halves / 401 mg   Avocado,  / 487 mg   Banana, 1 large / 487 mg   Beef, lean, round, 3 oz / 202 mg   Cantaloupe, 1 cup cubes / 427 mg   Dates, medjool, 5 whole / 835 mg   Ham, cured, 3 oz / 212 mg   Lentils, dried,  cup / 458 mg   Lima beans, frozen,  cup / 258 mg   Orange, 1 large / 333 mg   Orange juice, 1 cup / 443 mg   Peaches, dried,  cup / 398 mg   Peas, split, cooked,  cup / 355 mg   Potato, boiled, 1 medium / 515 mg   Prunes, dried, uncooked,  cup / 318 mg   Raisins,  cup / 309 mg   Salmon, pink, raw, 3 oz / 275 mg   Sardines, canned , 3 oz / 338 mg   Tomato, raw, 1 medium / 292 mg   Tomato juice, 6 oz / 417 mg   [place], 3 oz / 349 mg  Other Foods High in Potassium (greater than 250 mg):  Bran cereals and other bran products.   Milk (skim, 1%, 2%, whole).   Buttermilk.   Yogurt.   Nuts.   Dried fruits.   Cherries.   Sweet potatoes.   Oranges.   Baked Beans.   Broccoli.   Spinach.   Peanut butter.   Tofu.  Foods Lower in Potassium (less than 250 mg):  Pasta.   Rice.   Cottage cheese.   Cheddar cheese.   Apples.   Mango.   Grapes.   Grapefruit.   Pineapple.   Raspberries.   Strawberries.   Watermelon.   Green Beans.   Cabbage.   Carrots.   Cauliflower.   Celery.   Corn.    Mushrooms.   Onions.   Squash.   Eggs.  The list below tells you how big or small  some common portion sizes are:  1 oz.........4 stacked dice.   3 oz........Marland KitchenDeck of cards.   1 tsp.......Marland KitchenTip of little finger.   1 tbs......Marland KitchenMarland KitchenThumb.   2 tbs.......Marland KitchenGolf ball.    cup......Marland KitchenHalf of a fist.  1 cup.......Marland KitchenA fist.  Document Released: 05/17/2008 Document Revised: 08/11/2011 Document Reviewed: 04/14/2009 [facility] Patient Information 2012 [place], [place].Hypokalemia Hypokalemia means a low potassium level in the blood. Symptoms may include muscle weakness and cramping, fatigue, abdominal pain, vomiting, constipation, or irregularities of the heartbeat. Sometimes hypokalemia is discovered by your caregiver if you are taking certain medicines for high blood pressure or kidney disease.  Potassium is an electrolyte that helps regulate the amount of fluid in the body. It also stimulates muscle contraction and maintains a stable acid-base balance. If potassium levels go too low or too high, your health may be in danger. You are at risk for developing shock, heart, and lung problems. Hypokalemia can occur if you have excessive diarrhea, vomiting, or sweating. Potassium can be lost through your kidneys in the urine. Certain common medicines can also cause potassium loss, especially water pills (diuretics). The same is possible with cortisone medications or certain types of antibiotics. Low potassium can be dangerous if you are taking certain heart medicines. In diabetes, your potassium may fall after you take insulin, especially if your diabetes had been out of control for a while. In rare cases, potassium may be low because you are not getting enough in your diet.  In adults, a potassium level below 3.5 mEq/L is usually considered low. Hypokalemia can be treated with potassium supplements taken by mouth and a diet that is high in potassium. Foods with high potassium content are:  Peas, lentils, lima beans, nuts, and dried fruit.   Whole grain and bran cereals and breads.   Fresh fruit  and vegetables. Examples include:   Bananas.   Cantaloupe.   Grapefruit.   Oranges.   Tomatoes.   Honeydew melons.   Potatoes.   Peaches.   Orange and tomato juices.   Meats.  See your caregiver as instructed for a follow-up blood test to be sure your potassium is back to normal. SEEK MEDICAL CARE IF:   You have nausea, vomiting, constipation, or abdominal pain.   You have palpitations or irregular heartbeats, chest pain or shortness of breath.   You have muscle cramps or weakness or fatigue.   You have lethargy.  SEEK IMMEDIATE MEDICAL CARE IF:   You have paralysis.   You have confusion or other mental status changes.  Document Released: 11/29/2005 Document Revised: 08/11/2011 Document Reviewed: 03/25/2010 [facility] Patient Information 2012 [place], [place].Hypokalemia Hypokalemia means a low potassium level in the blood. Symptoms may include muscle weakness and cramping, fatigue, abdominal pain, vomiting, constipation, or irregularities of the heartbeat. Sometimes hypokalemia is discovered by your caregiver if you are taking certain medicines for high blood pressure or kidney disease.  Potassium is an electrolyte that helps regulate the amount of fluid in the body. It also stimulates muscle contraction and maintains a stable acid-base balance. If potassium levels go too low or too high, your health may be in danger. You are at risk for developing shock, heart, and lung problems. Hypokalemia can occur if you have excessive diarrhea, vomiting, or sweating. Potassium can be lost through your kidneys in the urine. Certain common medicines can also cause potassium loss, especially water pills (diuretics). The same is possible with cortisone medications or certain types of antibiotics. Low potassium can be dangerous if you are taking certain heart medicines. In diabetes, your potassium may fall after you take insulin, especially if your diabetes had been out of control for a while.  In rare cases, potassium may be low because you are not getting  enough in your diet.  In adults, a potassium level below 3.5 mEq/L is usually considered low. Hypokalemia can be treated with potassium supplements taken by mouth and a diet that is high in potassium. Foods with high potassium content are:  Peas, lentils, lima beans, nuts, and dried fruit.   Whole grain and bran cereals and breads.   Fresh fruit and vegetables. Examples include:   Bananas.   Cantaloupe.   Grapefruit.   Oranges.   Tomatoes.   Honeydew melons.   Potatoes.   Peaches.   Orange and tomato juices.   Meats.  See your caregiver as instructed for a follow-up blood test to be sure your potassium is back to normal. SEEK MEDICAL CARE IF:   You have nausea, vomiting, constipation, or abdominal pain.   You have palpitations or irregular heartbeats, chest pain or shortness of breath.   You have muscle cramps or weakness or fatigue.   You have lethargy.  SEEK IMMEDIATE MEDICAL CARE IF:   You have paralysis.   You have confusion or other mental status changes.  Document Released: 11/29/2005 Document Revised: 08/11/2011 Document Reviewed: 03/25/2010 [facility] Patient Information 2012 [place], [place].

## 2012-02-10 NOTE — Progress Notes (Signed)
Hasn't felt good for about a month. Diarrhea for past 10 days, coughing for a month, but has changed, non- productve.  Fiance has also been sick, seem to pass it back and forth past couple wks.

## 2012-02-10 NOTE — ED Provider Notes (Signed)
Medical Screening exam and patient care preformed by advanced practice provider.  Agree with the above management.  

## 2012-02-10 NOTE — ED Provider Notes (Signed)
History     CSN: [id number]  Arrival date & time 02/10/12  0714   None     Chief Complaint  Patient presents with  . Vomiting  . Diarrhea  . Cough   HPI Catherine Edwards is a 36 y.o. female @ [redacted]w[redacted]d gestation who presents to [facility] for nausea, vomiting, diarrhea and cough and congestion. N/V/D started about 7 days ago. Cough and congestion started about the same time. This morning had green crusty drainage from eyes. Productive cough yellow-green sputum. Has not started prenatal care. First appointment April 2nd with CC/OB.  Past Medical History  Diagnosis Date  . Asthma     Past Surgical History  Procedure Date  . Cholecystectomy     laproscopic  . Wisdom tooth extraction     Family History  Problem Relation Age of Onset  . Anesthesia problems Neg Hx     History  Substance Use Topics  . Smoking status: Never Smoker   . Smokeless tobacco: Never Used  . Alcohol Use: 0.6 oz/week    1 Shots of liquor per week    OB History    Grav Para Term Preterm Abortions TAB SAB Ect Mult Living   7    6  6          Review of Systems  Constitutional: Positive for chills. Negative for fever, diaphoresis and fatigue.  HENT: Positive for congestion, sore throat and sinus pressure. Negative for ear pain, facial swelling, neck pain, neck stiffness and dental problem.   Eyes: Positive for discharge and redness. Negative for photophobia and pain.  Respiratory: Negative for cough, chest tightness and wheezing.   Gastrointestinal: Positive for nausea, vomiting and diarrhea. Negative for abdominal pain, constipation and abdominal distention.       + FHT  Genitourinary: Positive for frequency. Negative for dysuria, flank pain, vaginal bleeding, difficulty urinating and pelvic pain.  Musculoskeletal: Positive for back pain. Negative for myalgias and gait problem.  Skin: Negative for color change and rash.  Neurological: Positive for headaches. Negative for dizziness, speech difficulty,  weakness, light-headedness and numbness.  Psychiatric/Behavioral: Negative for confusion and agitation. The patient is not nervous/anxious.     Allergies  Review of patient's allergies indicates no known allergies.  Home Medications  No current outpatient prescriptions on file.  BP 140/76  Pulse 98  Temp(Src) 99.2 F (37.3 C) (Oral)  Resp 20  Ht 5\' 5"  (1.651 m)  Wt 232 lb 6.4 oz (105.416 kg)  BMI 38.67 kg/m2  SpO2 97%  LMP 11/02/2011  Physical Exam  Nursing note and vitals reviewed. Constitutional: She is oriented to person, place, and time. She appears well-developed and well-nourished. No distress.  HENT:  Head: Normocephalic.  Mouth/Throat: Uvula is midline. Posterior oropharyngeal erythema present. No tonsillar abscesses.  Eyes: EOM are normal. Right eye exhibits discharge. Left eye exhibits discharge. Right conjunctiva is injected. Left conjunctiva is injected.  Neck: Neck supple.  Pulmonary/Chest: Effort normal. No respiratory distress. She has rhonchi in the left upper field.  Abdominal: Soft. There is no tenderness.  Musculoskeletal: Normal range of motion. She exhibits no edema.  Neurological: She is alert and oriented to person, place, and time. No cranial nerve deficit.  Skin: Skin is warm and dry.  Psychiatric: She has a normal mood and affect. Her behavior is normal. Judgment and thought content normal.   Results for orders placed during the hospital encounter of 02/10/12 (from the past 24 hour(s))  URINALYSIS, ROUTINE W REFLEX MICROSCOPIC  Status: Abnormal   Collection Time   02/10/12  7:23 AM      Component Value Range   Color, Urine YELLOW  YELLOW    APPearance CLEAR  CLEAR    Specific Gravity, Urine 1.025  1.005 - 1.030    pH 6.0  5.0 - 8.0    Glucose, UA NEGATIVE  NEGATIVE (mg/dL)   Hgb urine dipstick TRACE (*) NEGATIVE    Bilirubin Urine NEGATIVE  NEGATIVE    Ketones, ur 15 (*) NEGATIVE (mg/dL)   Protein, ur NEGATIVE  NEGATIVE (mg/dL)    Urobilinogen, UA 1.0  0.0 - 1.0 (mg/dL)   Nitrite NEGATIVE  NEGATIVE    Leukocytes, UA NEGATIVE  NEGATIVE   URINE MICROSCOPIC-ADD ON     Status: Abnormal   Collection Time   02/10/12  7:23 AM      Component Value Range   Squamous Epithelial / LPF FEW (*) RARE    WBC, UA 0-2  <3 (WBC/hpf)   RBC / HPF 3-6  <3 (RBC/hpf)  CBC     Status: Abnormal   Collection Time   02/10/12  8:34 AM      Component Value Range   WBC 11.3 (*) 4.0 - 10.5 (K/uL)   RBC 4.52  3.87 - 5.11 (MIL/uL)   Hemoglobin 12.4  12.0 - 15.0 (g/dL)   HCT 40.9  81.1 - 91.4 (%)   MCV 81.2  78.0 - 100.0 (fL)   MCH 27.4  26.0 - 34.0 (pg)   MCHC 33.8  30.0 - 36.0 (g/dL)   RDW 78.2  95.6 - 21.3 (%)   Platelets 283  150 - 400 (K/uL)  BASIC METABOLIC PANEL     Status: Abnormal   Collection Time   02/10/12  8:34 AM      Component Value Range   Sodium 134 (*) 135 - 145 (mEq/L)   Potassium 2.9 (*) 3.5 - 5.1 (mEq/L)   Chloride 101  96 - 112 (mEq/L)   CO2 24  19 - 32 (mEq/L)   Glucose, Bld 92  70 - 99 (mg/dL)   BUN 8  6 - 23 (mg/dL)   Creatinine, Ser 0.86 (*) 0.50 - 1.10 (mg/dL)   Calcium 8.6  8.4 - 57.8 (mg/dL)   GFR calc non Af Amer >90  >90 (mL/min)   GFR calc Af Amer >90  >90 (mL/min)    ED Course: discussed with Dr. Penne Lash and we will give po dose of K-Dur and L of IV LR and d/c home with z-pack and cough medication.  Procedures  Assessment: Nausea, vomiting and diarrhea @ 14.[redacted] weeks gestation   Hypokalemia   Bronchitis   Conjunctivitis  Plan:  Zofran 8 mg ODT and Rx to go home   Zithromax 500 mg po now and Rx to finish   Robitussin AC 10 cc now and Rx    Erythromycin opth ointment Rx   IV LR bolus   K-Dur 20 meq po   Follow up for prenatal care with CC/OB as planned   Return here as needed.         [place], [place] 02/10/12 1039

## 2012-02-10 NOTE — ED Notes (Signed)
Feeling less nauseated, more relaxed after zofran

## 2012-02-23 LAB — OB RESULTS CONSOLE ABO/RH: RH Type: POSITIVE

## 2012-02-23 LAB — OB RESULTS CONSOLE RUBELLA ANTIBODY, IGM: Rubella: IMMUNE

## 2012-02-23 LAB — OB RESULTS CONSOLE HIV ANTIBODY (ROUTINE TESTING): HIV: NONREACTIVE

## 2012-02-23 LAB — OB RESULTS CONSOLE HEPATITIS B SURFACE ANTIGEN: Hepatitis B Surface Ag: NEGATIVE

## 2012-02-23 LAB — OB RESULTS CONSOLE RPR: RPR: NONREACTIVE

## 2012-03-08 ENCOUNTER — Other Ambulatory Visit: Payer: Self-pay

## 2012-03-16 ENCOUNTER — Other Ambulatory Visit (HOSPITAL_COMMUNITY): Payer: Self-pay | Admitting: Obstetrics and Gynecology

## 2012-03-16 DIAGNOSIS — O289 Unspecified abnormal findings on antenatal screening of mother: Secondary | ICD-10-CM

## 2012-03-23 ENCOUNTER — Ambulatory Visit (HOSPITAL_COMMUNITY): Payer: Medicaid Other

## 2012-03-23 ENCOUNTER — Ambulatory Visit (HOSPITAL_COMMUNITY): Payer: Medicaid Other | Attending: Obstetrics and Gynecology

## 2012-03-27 ENCOUNTER — Other Ambulatory Visit (HOSPITAL_COMMUNITY): Payer: Self-pay | Admitting: Obstetrics and Gynecology

## 2012-03-27 DIAGNOSIS — O289 Unspecified abnormal findings on antenatal screening of mother: Secondary | ICD-10-CM

## 2012-03-30 ENCOUNTER — Encounter (HOSPITAL_COMMUNITY): Payer: Self-pay

## 2012-03-30 ENCOUNTER — Ambulatory Visit (HOSPITAL_COMMUNITY)
Admission: RE | Admit: 2012-03-30 | Discharge: 2012-03-30 | Disposition: A | Discharge status: Home / Self Care | Payer: Medicaid Other | Source: Ambulatory Visit | Attending: Obstetrics and Gynecology | Admitting: Obstetrics and Gynecology

## 2012-03-30 DIAGNOSIS — Z363 Encounter for antenatal screening for malformations: Secondary | ICD-10-CM | POA: Insufficient documentation

## 2012-03-30 DIAGNOSIS — O358XX0 Maternal care for other (suspected) fetal abnormality and damage, not applicable or unspecified: Secondary | ICD-10-CM | POA: Insufficient documentation

## 2012-03-30 DIAGNOSIS — O280 Abnormal hematological finding on antenatal screening of mother: Secondary | ICD-10-CM | POA: Insufficient documentation

## 2012-03-30 DIAGNOSIS — Z1389 Encounter for screening for other disorder: Secondary | ICD-10-CM | POA: Insufficient documentation

## 2012-03-30 DIAGNOSIS — O289 Unspecified abnormal findings on antenatal screening of mother: Secondary | ICD-10-CM | POA: Insufficient documentation

## 2012-03-30 NOTE — Progress Notes (Signed)
Genetic Counseling  High-Risk Gestation Note  Appointment Date:  03/30/2012 Referred By: Almon Hercules., MD Date of Birth:  November 26, 1976 Partner: Catherine Edwards    Pregnancy History: [id number] Estimated Date of Delivery: 08/16/12 Estimated Gestational Age: [redacted]w[redacted]d Attending: Particia Nearing, MD   Ms. Catherine Edwards and her partner, Mr. Catherine Edwards, were seen for genetic counseling because of an increased risk for fetal Down syndrome based on Quad screening performed through [facility].  They were counseled regarding the Quad screen result and the associated 1 in 124 risk for fetal Down syndrome.  We reviewed chromosomes, nondisjunction, and the common features and variable prognosis of Down syndrome.  In addition, we reviewed the screen adjusted reduction in risks for trisomy 18 and ONTDs (less than 1 in 5,000 for each).  We also discussed other explanations for a screen positive result including: a gestational dating error, differences in maternal metabolism, and normal variation.  We reviewed other available screening and diagnostic options including detailed ultrasound and amniocentesis.  We discussed the risks, limitations, and benefits of each. We discussed another type of screening test, noninvasive prenatal testing (NIPT), which utilizes cell free fetal DNA found in the maternal circulation. This test is not diagnostic for chromosome conditions, but can provide information regarding the presence or absence of extra fetal DNA for chromosomes 13, 18 and 21. Thus, it would not identify or rule out all fetal aneuploidy. The reported detection rate is greater than 99% for Trisomy 21, greater than 97% for Trisomy 18, and is approximately 80% (8 out of 10) for Trisomy 13. The false positive rate is reported to be less than 1% for any of these conditions. After thoughtful consideration of these options, Ms. Catherine Edwards elected to have ultrasound, but declined amniocentesis and cell  free fetal DNA testing.  They understand that ultrasound cannot rule out all birth defects or genetic syndromes.  The patient was advised of this limitation and states she still does not want diagnostic testing or cell free fetal DNA testing at this time.   However, they were counseled that 50-80% of fetuses with Down syndrome, when well visualized, have detectable anomalies or soft markers by ultrasound.  A complete ultrasound was performed today.  The ultrasound report will be sent under separate cover.  Both family histories were reviewed and found to be contributory for six first trimester pregnancy losses for the couple. No underlying cause is known, and the couple has not had a recurrent miscarriage workup. Approximately 1 in 6 confirmed pregnancies results in miscarriage. A single underlying cause is more likely to be suspected when a couple has experienced 3 or more losses. It is less likely that there will be an identifiable single underlying cause when a couple has experienced less than 3 losses. We discussed several possible causes including chromosome rearrangements, antibodies, and thrombophilia. We reviewed chromosomes and examples of chromosome conditions. In approximately 3-8% of couples with recurrent pregnancy loss, one partner carries a chromosome variant, such as a balanced translocation. Being a carrier of a chromosome variant can increase the risk for abnormalities in the sperm or egg cell, which can increase the risk for miscarriage or the birth of a child with birth defects and/or mental retardation. We reviewed that inherited predisposition to clotting can also increase the risk for miscarriage given the association with increased risk for disrupted blood flow in the pregnancy. Additionally, the presence of certain antibodies have been associated with an increased risk for miscarriage.The couple is not interested in  pursuing additional studies to assess for an underlying cause for the  previous miscarriages at this time.   The father of the pregnancy reported a maternal first cousin with mental retardation due to prenatal alcohol exposure. We discussed that there are many different causes of mental retardation such as genetic differences, sporadic causes, or injuries.   Prenatal alcohol exposure can increase the risk for growth delays, small head size, heart defects, eye and facial differences, as well as behavior problems and learning disabilities. The risk of these to occur tends to increase with the amount of alcohol consumed. We discussed that given the reported information that his cousins mental delays are attributed to prenatal alcohol use, recurrence risk for relatives would not be expected to be increased. In the case of a different underlying cause, recurrence risk estimate may change.    Additionally, the father of the pregnancy reported that he has sickle cell trait. Ms. Catherine Edwards' OB medical records indicated that she has had a normal hemoglobin electrophoresis, indicating that she is not a carrier for sickle cell trait or other hemoglobin variant. We discussed sickle cell anemia (SCA) including the carrier frequency and incidence in the Hispanic/African-American population, the availability of carrier testing and prenatal diagnosis if indicated.  We discussed that given the available information, the current pregnancy has a 1 in 2 chance to have sickle cell trait but is not expected to be at increased risk for sickle cell anemia. In addition, we discussed that hemoglobinopathies are routinely screened for as part of the [place] newborn screening panel. Without further information regarding the provided family history, an accurate genetic risk cannot be calculated. Further genetic counseling is warranted if more information is obtained.  Ms. Catherine Edwards denied exposure to environmental toxins or chemical agents. She denied the use of alcohol, tobacco or street drugs. She  denied significant viral illnesses during the course of her pregnancy. Her medical and surgical histories were contributory for asthma, for which she is treated with Ventolin.    I counseled this couple for approximately 35 minutes regarding the above risks and available options.     Quinn Plowman, MS,  Certified Genetic Counselor  03/30/2012

## 2012-07-20 LAB — OB RESULTS CONSOLE GBS: GBS: POSITIVE

## 2012-08-12 ENCOUNTER — Encounter (HOSPITAL_COMMUNITY): Payer: Self-pay | Admitting: Anesthesiology

## 2012-08-12 ENCOUNTER — Encounter (HOSPITAL_COMMUNITY): Payer: Self-pay | Admitting: *Deleted

## 2012-08-12 ENCOUNTER — Inpatient Hospital Stay (HOSPITAL_COMMUNITY)
Admission: AD | Admit: 2012-08-12 | Discharge: 2012-08-15 | DRG: 774 | Disposition: A | Discharge status: Home / Self Care | Payer: Medicaid Other | Source: Ambulatory Visit | Attending: Obstetrics and Gynecology | Admitting: Obstetrics and Gynecology

## 2012-08-12 ENCOUNTER — Inpatient Hospital Stay (HOSPITAL_COMMUNITY): Payer: Medicaid Other | Admitting: Anesthesiology

## 2012-08-12 DIAGNOSIS — O280 Abnormal hematological finding on antenatal screening of mother: Secondary | ICD-10-CM

## 2012-08-12 DIAGNOSIS — Z22330 Carrier of Group B streptococcus: Secondary | ICD-10-CM

## 2012-08-12 DIAGNOSIS — O99892 Other specified diseases and conditions complicating childbirth: Principal | ICD-10-CM | POA: Diagnosis present

## 2012-08-12 LAB — CBC
HCT: 37.8 % (ref 36.0–46.0)
Hemoglobin: 12.8 g/dL (ref 12.0–15.0)
MCH: 28.1 pg (ref 26.0–34.0)
MCHC: 33.9 g/dL (ref 30.0–36.0)
MCV: 82.9 fL (ref 78.0–100.0)
Platelets: 239 10*3/uL (ref 150–400)
RBC: 4.56 MIL/uL (ref 3.87–5.11)
RDW: 14.8 % (ref 11.5–15.5)
WBC: 14.2 10*3/uL — ABNORMAL HIGH (ref 4.0–10.5)

## 2012-08-12 LAB — PREPARE RBC (CROSSMATCH)

## 2012-08-12 LAB — OB RESULTS CONSOLE ANTIBODY SCREEN: Antibody Screen: NEGATIVE

## 2012-08-12 MED ORDER — IBUPROFEN 600 MG PO TABS: 600.0000 mg | ORAL_TABLET | Freq: Four times a day (QID) | ORAL | Status: DC | PRN

## 2012-08-12 MED ORDER — FENTANYL 2.5 MCG/ML BUPIVACAINE 1/10 % EPIDURAL INFUSION (WH - ANES)
14.0000 mL/h | INTRAMUSCULAR | Status: DC
Start: 2012-08-12 — End: 2012-08-13
  Administered 2012-08-12 – 2012-08-13 (×2): 14 mL/h via EPIDURAL
  Filled 2012-08-12 (×3): qty 60

## 2012-08-12 MED ORDER — LACTATED RINGERS IV SOLN
INTRAVENOUS | Status: DC
  Administered 2012-08-12: 21:00:00 via INTRAVENOUS
  Administered 2012-08-12: 125 mL/h via INTRAVENOUS

## 2012-08-12 MED ORDER — OXYCODONE-ACETAMINOPHEN 5-325 MG PO TABS: 1.0000 | ORAL_TABLET | ORAL | Status: DC | PRN

## 2012-08-12 MED ORDER — BUTORPHANOL TARTRATE 1 MG/ML IJ SOLN
1.0000 mg | INTRAMUSCULAR | Status: DC | PRN
  Administered 2012-08-12 (×2): 1 mg via INTRAVENOUS
  Filled 2012-08-12: qty 1

## 2012-08-12 MED ORDER — EPHEDRINE 5 MG/ML INJ
10.0000 mg | INTRAVENOUS | Status: DC | PRN
Start: 2012-08-12 — End: 2012-08-13
  Filled 2012-08-12: qty 4

## 2012-08-12 MED ORDER — LIDOCAINE HCL (PF) 1 % IJ SOLN
30.0000 mL | INTRAMUSCULAR | Status: DC | PRN
  Filled 2012-08-12: qty 30

## 2012-08-12 MED ORDER — ACETAMINOPHEN 325 MG PO TABS: 650.0000 mg | ORAL_TABLET | ORAL | Status: DC | PRN

## 2012-08-12 MED ORDER — PHENYLEPHRINE 40 MCG/ML (10ML) SYRINGE FOR IV PUSH (FOR BLOOD PRESSURE SUPPORT): 80.0000 ug | PREFILLED_SYRINGE | INTRAVENOUS | Status: DC | PRN

## 2012-08-12 MED ORDER — OXYTOCIN BOLUS FROM INFUSION
250.0000 mL | Freq: Once | INTRAVENOUS | Status: DC
  Filled 2012-08-12: qty 500

## 2012-08-12 MED ORDER — SODIUM BICARBONATE 8.4 % IV SOLN
INTRAVENOUS | Status: DC | PRN
  Administered 2012-08-12: 4 mL via EPIDURAL

## 2012-08-12 MED ORDER — EPHEDRINE 5 MG/ML INJ
10.0000 mg | INTRAVENOUS | Status: DC | PRN
Start: 2012-08-12 — End: 2012-08-13

## 2012-08-12 MED ORDER — OXYTOCIN 40 UNITS IN LACTATED RINGERS INFUSION - SIMPLE MED
62.5000 mL/h | Freq: Once | INTRAVENOUS | Status: AC
  Administered 2012-08-13: 62.5 mL/h via INTRAVENOUS
  Filled 2012-08-12: qty 1000

## 2012-08-12 MED ORDER — ONDANSETRON HCL 4 MG/2ML IJ SOLN: 4.0000 mg | Freq: Four times a day (QID) | INTRAMUSCULAR | Status: DC | PRN

## 2012-08-12 MED ORDER — PHENYLEPHRINE 40 MCG/ML (10ML) SYRINGE FOR IV PUSH (FOR BLOOD PRESSURE SUPPORT)
80.0000 ug | PREFILLED_SYRINGE | INTRAVENOUS | Status: DC | PRN
Start: 2012-08-12 — End: 2012-08-13
  Filled 2012-08-12: qty 5

## 2012-08-12 MED ORDER — PENICILLIN G POTASSIUM 5000000 UNITS IJ SOLR
5.0000 10*6.[IU] | Freq: Once | INTRAVENOUS | Status: AC
  Administered 2012-08-12: 5 10*6.[IU] via INTRAVENOUS
  Filled 2012-08-12: qty 5

## 2012-08-12 MED ORDER — LACTATED RINGERS IV SOLN: 500.0000 mL | INTRAVENOUS | Status: DC | PRN

## 2012-08-12 MED ORDER — FENTANYL 2.5 MCG/ML BUPIVACAINE 1/10 % EPIDURAL INFUSION (WH - ANES)
INTRAMUSCULAR | Status: DC | PRN
  Administered 2012-08-12: 14 mL/h via EPIDURAL

## 2012-08-12 MED ORDER — FLEET ENEMA 7-19 GM/118ML RE ENEM: 1.0000 | ENEMA | RECTAL | Status: DC | PRN

## 2012-08-12 MED ORDER — CITRIC ACID-SODIUM CITRATE 334-500 MG/5ML PO SOLN: 30.0000 mL | ORAL | Status: DC | PRN

## 2012-08-12 MED ORDER — BUTORPHANOL TARTRATE 1 MG/ML IJ SOLN
INTRAMUSCULAR | Status: AC
  Administered 2012-08-12: 1 mg via INTRAVENOUS
  Filled 2012-08-12: qty 1

## 2012-08-12 MED ORDER — LACTATED RINGERS IV SOLN
500.0000 mL | Freq: Once | INTRAVENOUS | Status: AC
  Administered 2012-08-12: 1000 mL via INTRAVENOUS

## 2012-08-12 MED ORDER — DIPHENHYDRAMINE HCL 50 MG/ML IJ SOLN: 12.5000 mg | INTRAMUSCULAR | Status: DC | PRN

## 2012-08-12 MED ORDER — PENICILLIN G POTASSIUM 5000000 UNITS IJ SOLR
2.5000 10*6.[IU] | INTRAVENOUS | Status: DC
  Administered 2012-08-12 – 2012-08-13 (×3): 2.5 10*6.[IU] via INTRAVENOUS
  Filled 2012-08-12 (×6): qty 2.5

## 2012-08-12 NOTE — Anesthesia Preprocedure Evaluation (Signed)
Anesthesia Evaluation  Patient identified by MRN, date of birth, ID band Patient awake    Reviewed: Allergy & Precautions, H&P , Patient's Chart, lab work & pertinent test results  Airway Mallampati: II TM Distance: >3 FB Neck ROM: full    Dental  (+) Teeth Intact   Pulmonary asthma (rare inhaler use) ,  breath sounds clear to auscultation        Cardiovascular Rhythm:regular Rate:Normal     Neuro/Psych    GI/Hepatic   Endo/Other  Morbid obesity  Renal/GU      Musculoskeletal   Abdominal   Peds  Hematology   Anesthesia Other Findings       Reproductive/Obstetrics (+) Pregnancy                           Anesthesia Physical Anesthesia Plan  ASA: II  Anesthesia Plan: Epidural   Post-op Pain Management:    Induction:   Airway Management Planned:   Additional Equipment:   Intra-op Plan:   Post-operative Plan:   Informed Consent: I have reviewed the patients History and Physical, chart, labs and discussed the procedure including the risks, benefits and alternatives for the proposed anesthesia with the patient or authorized representative who has indicated his/her understanding and acceptance.   Dental Advisory Given  Plan Discussed with:   Anesthesia Plan Comments: (Labs checked- platelets confirmed with RN in room. Fetal heart tracing, per RN, reported to be stable enough for sitting procedure. Discussed epidural, and patient consents to the procedure:  included risk of possible headache,backache, failed block, allergic reaction, and nerve injury. This patient was asked if she had any questions or concerns before the procedure started. )        Anesthesia Quick Evaluation

## 2012-08-12 NOTE — Anesthesia Procedure Notes (Signed)
Epidural Patient location during procedure: OB  Preanesthetic Checklist Completed: patient identified, site marked, surgical consent, pre-op evaluation, timeout performed, IV checked, risks and benefits discussed and monitors and equipment checked  Epidural Patient position: sitting Prep: site prepped and draped and DuraPrep Patient monitoring: continuous pulse ox and blood pressure Approach: midline Injection technique: LOR air  Needle:  Needle type: Tuohy  Needle gauge: 17 G Needle length: 9 cm and 9 Needle insertion depth: 6 cm Catheter type: closed end flexible Catheter size: 19 Gauge Catheter at skin depth: 12 cm Test dose: negative  Assessment Events: blood not aspirated, injection not painful, no injection resistance, negative IV test and no paresthesia  Additional Notes Dosing of Epidural:  1st dose, through needle ............................................. epi 1:200K + Xylocaine 40 mg  2nd dose, through catheter, after waiting 3 minutes.....epi 1:200K + Xylocaine 40 mg  3rd dose, through catheter after waiting 3 minutes .............................Marcaine   4mg   ( mg Marcaine are expressed as equivilent  cc's medication removed from the 0.1%Bupiv / fentanyl syringe from L&D pump)  ( 2% Xylo charted as a single dose in Epic Meds for ease of charting; actual dosing was fractionated as above, for saftey's sake)  As each dose occurred, patient was free of IV sx; and patient exhibited no evidence of SA injection.  Patient is more comfortable after epidural dosed. Please see RN's note for documentation of vital signs,and FHR which are stable.  Patient reminded not to try to ambulate with numb legs, and that an RN must be present the 1st time she attempts to get up.    

## 2012-08-12 NOTE — Progress Notes (Signed)
Recheck cervix x 1 hr may walk

## 2012-08-12 NOTE — H&P (Signed)
36 y.o. [redacted]w[redacted]d  [id number] comes in c/o Labor.  Otherwise has good fetal movement and no bleeding.  Past Medical History  Diagnosis Date  . Asthma     Past Surgical History  Procedure Date  . Cholecystectomy     laproscopic  . Wisdom tooth extraction     OB History    Grav Para Term Preterm Abortions TAB SAB Ect Mult Living   7    6  6         # Outc Date GA Lbr Len/2nd Wgt Sex Del Anes PTL Lv   1 SAB            2 SAB            3 SAB            4 SAB            5 SAB            6 SAB            7 CUR               History   Social History  . Marital Status: Single    Spouse Name: N/A    Number of Children: N/A  . Years of Education: N/A   Occupational History  . Not on file.   Social History Main Topics  . Smoking status: Never Smoker   . Smokeless tobacco: Never Used  . Alcohol Use: 0.6 oz/week    1 Shots of liquor per week     not since pregnancy  . Drug Use: No  . Sexually Active: Yes    Birth Control/ Protection: None   Other Topics Concern  . Not on file   Social History Narrative  . No narrative on file   Review of patient's allergies indicates no known allergies.   Prenatal Course:   1. 1:124 Risk downs- pt declined amnio.  Normal anatomy seen on U/S. 2.  Asthma- rare MDI use./  Filed Vitals:   08/12/12 1901  BP: 130/78  Pulse: 75  Temp:   Resp: 20     Lungs/Cor:  NAD Abdomen:  soft, gravid Ex:  no cords, erythema SVE:  5/C/-2, arom clear FHTs:  130s, good STV, NST R Toco:  q3-4   A/P   Term labor.  GBS pos- pcn.   Prenatal Transfer Tool  Maternal Diabetes: No Genetic Screening: Abnormal:  Results: Elevated risk of Trisomy 21, Other:no amnio, U/S nml Maternal Ultrasounds/Referrals: Normal Fetal Ultrasounds or other Referrals:  None Maternal Substance Abuse:  No Significant Maternal Medications:  None Significant Maternal Lab Results: None    Prim Morace A

## 2012-08-12 NOTE — MAU Note (Signed)
Pt reports having ctx on and off since last night. Reports some bloody show and good fetal movment

## 2012-08-12 NOTE — Progress Notes (Signed)
Admit pt to L&D 

## 2012-08-13 ENCOUNTER — Encounter (HOSPITAL_COMMUNITY): Payer: Self-pay | Admitting: *Deleted

## 2012-08-13 LAB — RPR: RPR Ser Ql: NONREACTIVE

## 2012-08-13 MED ORDER — METHYLERGONOVINE MALEATE 0.2 MG PO TABS: 0.2000 mg | ORAL_TABLET | ORAL | Status: DC | PRN

## 2012-08-13 MED ORDER — ONDANSETRON HCL 4 MG/2ML IJ SOLN: 4.0000 mg | INTRAMUSCULAR | Status: DC | PRN

## 2012-08-13 MED ORDER — LANOLIN HYDROUS EX OINT: TOPICAL_OINTMENT | CUTANEOUS | Status: DC | PRN

## 2012-08-13 MED ORDER — PNEUMOCOCCAL VAC POLYVALENT 25 MCG/0.5ML IJ INJ
0.5000 mL | INJECTION | INTRAMUSCULAR | Status: AC
  Filled 2012-08-13: qty 0.5

## 2012-08-13 MED ORDER — SODIUM CHLORIDE 0.9 % IJ SOLN: 3.0000 mL | INTRAMUSCULAR | Status: DC | PRN

## 2012-08-13 MED ORDER — ZOLPIDEM TARTRATE 5 MG PO TABS: 5.0000 mg | ORAL_TABLET | Freq: Every evening | ORAL | Status: DC | PRN

## 2012-08-13 MED ORDER — DIBUCAINE 1 % RE OINT: 1.0000 "application " | TOPICAL_OINTMENT | RECTAL | Status: DC | PRN

## 2012-08-13 MED ORDER — MEASLES, MUMPS & RUBELLA VAC ~~LOC~~ INJ
0.5000 mL | INJECTION | Freq: Once | SUBCUTANEOUS | Status: DC
  Filled 2012-08-13: qty 0.5

## 2012-08-13 MED ORDER — SIMETHICONE 80 MG PO CHEW: 80.0000 mg | CHEWABLE_TABLET | ORAL | Status: DC | PRN

## 2012-08-13 MED ORDER — TETANUS-DIPHTH-ACELL PERTUSSIS 5-2.5-18.5 LF-MCG/0.5 IM SUSP
0.5000 mL | Freq: Once | INTRAMUSCULAR | Status: AC
  Administered 2012-08-14: 0.5 mL via INTRAMUSCULAR
  Filled 2012-08-13: qty 0.5

## 2012-08-13 MED ORDER — BENZOCAINE-MENTHOL 20-0.5 % EX AERO
1.0000 "application " | INHALATION_SPRAY | CUTANEOUS | Status: DC | PRN
  Administered 2012-08-13: 1 via TOPICAL
  Filled 2012-08-13: qty 56

## 2012-08-13 MED ORDER — WITCH HAZEL-GLYCERIN EX PADS: 1.0000 "application " | MEDICATED_PAD | CUTANEOUS | Status: DC | PRN

## 2012-08-13 MED ORDER — PRENATAL MULTIVITAMIN CH
1.0000 | ORAL_TABLET | Freq: Every day | ORAL | Status: DC
  Administered 2012-08-13 – 2012-08-15 (×3): 1 via ORAL
  Filled 2012-08-13 (×3): qty 1

## 2012-08-13 MED ORDER — ONDANSETRON HCL 4 MG PO TABS: 4.0000 mg | ORAL_TABLET | ORAL | Status: DC | PRN

## 2012-08-13 MED ORDER — OXYCODONE-ACETAMINOPHEN 5-325 MG PO TABS
1.0000 | ORAL_TABLET | ORAL | Status: DC | PRN
  Filled 2012-08-13: qty 2

## 2012-08-13 MED ORDER — METHYLERGONOVINE MALEATE 0.2 MG/ML IJ SOLN: 0.2000 mg | INTRAMUSCULAR | Status: DC | PRN

## 2012-08-13 MED ORDER — ALBUTEROL SULFATE HFA 108 (90 BASE) MCG/ACT IN AERS
1.0000 | INHALATION_SPRAY | RESPIRATORY_TRACT | Status: DC | PRN
  Administered 2012-08-13: 2 via RESPIRATORY_TRACT
  Filled 2012-08-13: qty 6.7

## 2012-08-13 MED ORDER — SODIUM CHLORIDE 0.9 % IJ SOLN
3.0000 mL | Freq: Two times a day (BID) | INTRAMUSCULAR | Status: DC
  Administered 2012-08-13: 3 mL via INTRAVENOUS

## 2012-08-13 MED ORDER — DIPHENHYDRAMINE HCL 25 MG PO CAPS: 25.0000 mg | ORAL_CAPSULE | Freq: Four times a day (QID) | ORAL | Status: DC | PRN

## 2012-08-13 MED ORDER — SODIUM CHLORIDE 0.9 % IV SOLN: 250.0000 mL | INTRAVENOUS | Status: DC | PRN

## 2012-08-13 MED ORDER — FERROUS SULFATE 325 (65 FE) MG PO TABS
325.0000 mg | ORAL_TABLET | Freq: Two times a day (BID) | ORAL | Status: DC
  Administered 2012-08-13 – 2012-08-15 (×5): 325 mg via ORAL
  Filled 2012-08-13 (×5): qty 1

## 2012-08-13 MED ORDER — IBUPROFEN 800 MG PO TABS
800.0000 mg | ORAL_TABLET | Freq: Three times a day (TID) | ORAL | Status: DC
  Administered 2012-08-13 – 2012-08-15 (×6): 800 mg via ORAL
  Filled 2012-08-13 (×8): qty 1

## 2012-08-13 MED ORDER — SENNOSIDES-DOCUSATE SODIUM 8.6-50 MG PO TABS
2.0000 | ORAL_TABLET | Freq: Every day | ORAL | Status: DC
  Administered 2012-08-14 (×2): 2 via ORAL

## 2012-08-13 MED ORDER — MAGNESIUM HYDROXIDE 400 MG/5ML PO SUSP: 30.0000 mL | ORAL | Status: DC | PRN

## 2012-08-13 NOTE — Progress Notes (Signed)
Dr Henderson Cloud called for update on pt, informed of sve, fhr, uc pattern, no new orders received.

## 2012-08-13 NOTE — Anesthesia Postprocedure Evaluation (Signed)
Anesthesia Post Note  Patient: Catherine Edwards  Procedure(s) Performed: * No procedures listed *  Anesthesia type: Epidural  Patient location: Mother/Baby  Post pain: Pain level controlled  Post assessment: Post-op Vital signs reviewed  Last Vitals:  Filed Vitals:   08/13/12 0720  BP: 109/70  Pulse: 112  Temp: 36.7 C  Resp: 20    Post vital signs: Reviewed  Level of consciousness: awake  Complications: No apparent anesthesia complications

## 2012-08-14 LAB — CBC
HCT: 34 % — ABNORMAL LOW (ref 36.0–46.0)
Hemoglobin: 11.3 g/dL — ABNORMAL LOW (ref 12.0–15.0)
MCH: 27.9 pg (ref 26.0–34.0)
MCHC: 33.2 g/dL (ref 30.0–36.0)
MCV: 84 fL (ref 78.0–100.0)
Platelets: 227 10*3/uL (ref 150–400)
RBC: 4.05 MIL/uL (ref 3.87–5.11)
RDW: 15.3 % (ref 11.5–15.5)
WBC: 14.6 10*3/uL — ABNORMAL HIGH (ref 4.0–10.5)

## 2012-08-14 NOTE — Progress Notes (Signed)
UR chart review completed.  

## 2012-08-14 NOTE — Progress Notes (Signed)
Patient is eating, ambulating, voiding.  Pain control is good.  Pt reports minimal bleeding, some tenderness over fundus of uterus - mild.  Denies fevers.  Filed Vitals:   08/13/12 1447 08/13/12 1713 08/13/12 2132 08/14/12 0534  BP: 112/61 105/68 114/77 116/71  Pulse: 81 81 96 87  Temp: 98.2 F (36.8 C) 98.6 F (37 C) 98.5 F (36.9 C) 98.2 F (36.8 C)  TempSrc:  Oral Oral Oral  Resp: 18 20 18 18   Height:      Weight:      SpO2:  97%      Fundus firm No CT  Lab Results  Component Value Date   WBC 14.6* 08/14/2012   HGB 11.3* 08/14/2012   HCT 34.0* 08/14/2012   MCV 84.0 08/14/2012   PLT 227 08/14/2012    --/--/A POS (08/31 1500)/RI  A/P Post partum day 1. Pt with fever to 102 in labor, treated with ampicllin and tylenol per Dr. Kittie Plater note.  Will continue to monitor vital signs and symptoms for endometritis.  Routine care.  Expect d/c 9/3.    Philip Aspen

## 2012-08-15 NOTE — Discharge Summary (Signed)
Obstetric Discharge Summary Reason for Admission: onset of labor Prenatal Procedures: ultrasound Intrapartum Procedures: episiotomy midline Postpartum Procedures: none Complications-Operative and Postpartum: none Hemoglobin  Date Value Range Status  08/14/2012 11.3* 12.0 - 15.0 g/dL Final     HCT  Date Value Range Status  08/14/2012 34.0* 36.0 - 46.0 % Final    Physical Exam:  General: alert Lochia: appropriate Uterine Fundus: firm  Discharge Diagnoses: Term Pregnancy-delivered  Discharge Information: Date: 08/15/2012 Activity: pelvic rest Diet: routine Medications: PNV and Ibuprofen Condition: stable Instructions: refer to practice specific booklet Discharge to: home Follow-up Information    Schedule an appointment as soon as possible for a visit with HORVATH,MICHELLE A, MD.   Contact information:   [address] [place] [address] [phone]          Newborn Data: Live born female  Birth Weight: 6 lb 4 oz (2835 g) APGAR: 9, 9  Home with mother.  Bellarose Burtt D 08/15/2012, 9:11 AM

## 2012-08-16 LAB — TYPE AND SCREEN
ABO/RH(D): A POS
Antibody Screen: NEGATIVE
Unit division: 0
Unit division: 0
Unit division: 0
Unit division: 0

## 2012-08-19 ENCOUNTER — Emergency Department (HOSPITAL_COMMUNITY): Payer: Medicaid Other

## 2012-08-19 ENCOUNTER — Other Ambulatory Visit (HOSPITAL_COMMUNITY): Payer: Medicaid Other

## 2012-08-19 ENCOUNTER — Emergency Department (HOSPITAL_COMMUNITY)
Admission: EM | Admit: 2012-08-19 | Discharge: 2012-08-19 | Disposition: A | Discharge status: Home / Self Care | Payer: Medicaid Other | Attending: Emergency Medicine | Admitting: Emergency Medicine

## 2012-08-19 ENCOUNTER — Encounter (HOSPITAL_COMMUNITY): Payer: Self-pay | Admitting: *Deleted

## 2012-08-19 DIAGNOSIS — R0600 Dyspnea, unspecified: Secondary | ICD-10-CM

## 2012-08-19 DIAGNOSIS — R0989 Other specified symptoms and signs involving the circulatory and respiratory systems: Secondary | ICD-10-CM | POA: Insufficient documentation

## 2012-08-19 DIAGNOSIS — R0609 Other forms of dyspnea: Secondary | ICD-10-CM | POA: Insufficient documentation

## 2012-08-19 LAB — POCT I-STAT, CHEM 8
BUN: 21 mg/dL (ref 6–23)
Calcium, Ion: 1.17 mmol/L (ref 1.12–1.23)
Chloride: 109 mEq/L (ref 96–112)
Creatinine, Ser: 0.8 mg/dL (ref 0.50–1.10)
Glucose, Bld: 94 mg/dL (ref 70–99)
HCT: 32 % — ABNORMAL LOW (ref 36.0–46.0)
Hemoglobin: 10.9 g/dL — ABNORMAL LOW (ref 12.0–15.0)
Potassium: 3.9 mEq/L (ref 3.5–5.1)
Sodium: 142 mEq/L (ref 135–145)
TCO2: 21 mmol/L (ref 0–100)

## 2012-08-19 LAB — POCT I-STAT TROPONIN I: Troponin i, poc: 0 ng/mL (ref 0.00–0.08)

## 2012-08-19 NOTE — ED Provider Notes (Signed)
History     CSN: [id number]  Arrival date & time 08/19/12  4098   First MD Initiated Contact with Patient 08/19/12 0631      Chief Complaint  Patient presents with  . Chest Pain    (Consider location/radiation/quality/duration/timing/severity/associated sxs/prior treatment) Patient is a 36 y.o. female presenting with shortness of breath. The history is provided by the patient.  Shortness of Breath  The current episode started yesterday. Associated symptoms include shortness of breath. Pertinent negatives include no chest pain and no fever. Associated symptoms comments: She reports she woke last night "gasping for air". She denies chest pain. She has a history of asthma but states that this did not feel like her asthma and there was no relief with her Albuterol inhaler. No fever or cough. No nausea or abdominal pain. She reports uncomplicated vaginal delivery of her daughter on 08/13/12 that was a full term pregnancy. .    Past Medical History  Diagnosis Date  . Asthma     Past Surgical History  Procedure Date  . Cholecystectomy     laproscopic  . Wisdom tooth extraction     Family History  Problem Relation Age of Onset  . Anesthesia problems Neg Hx     History  Substance Use Topics  . Smoking status: Never Smoker   . Smokeless tobacco: Never Used  . Alcohol Use: 0.6 oz/week    1 Shots of liquor per week     not since pregnancy    OB History    Grav Para Term Preterm Abortions TAB SAB Ect Mult Living   7 1 1  6  6   1       Review of Systems  Constitutional: Negative for fever.  Respiratory: Positive for shortness of breath. Negative for chest tightness.   Cardiovascular: Negative for chest pain and leg swelling.  Gastrointestinal: Negative for nausea, vomiting and abdominal pain.  Musculoskeletal: Negative for back pain.  Skin: Negative for rash.    Allergies  Review of patient's allergies indicates no known allergies.  Home Medications   Current  Outpatient Rx  Name Route Sig Dispense Refill  . ALBUTEROL SULFATE HFA 108 (90 BASE) MCG/ACT IN AERS Inhalation Inhale 2 puffs into the lungs every 6 (six) hours as needed. Wheezing or shortness of breath    . PRENATAL MULTIVITAMIN CH Oral Take 1 tablet by mouth every morning.       BP 140/85  Pulse 67  Temp 98.4 F (36.9 C) (Oral)  Resp 25  SpO2 93%  LMP 11/08/2011  Breastfeeding? Yes  Physical Exam  Constitutional: She appears well-developed and well-nourished.  HENT:  Head: Normocephalic.  Neck: Normal range of motion. Neck supple.  Cardiovascular: Normal rate and regular rhythm.   No murmur heard. Pulmonary/Chest: Effort normal and breath sounds normal. She has no wheezes. She has no rales. She exhibits no tenderness.  Abdominal: Soft. Bowel sounds are normal. There is no tenderness. There is no rebound and no guarding.  Musculoskeletal: Normal range of motion. She exhibits no edema.  Neurological: She is alert. No cranial nerve deficit.  Skin: Skin is warm and dry. No rash noted.  Psychiatric: She has a normal mood and affect.    ED Course  Procedures (including critical care time)  Labs Reviewed  POCT I-STAT, CHEM 8 - Abnormal; Notable for the following:    Hemoglobin 10.9 (*)     HCT 32.0 (*)     All other components within normal limits  POCT  I-STAT TROPONIN I   Results for orders placed during the hospital encounter of 08/19/12  POCT I-STAT, CHEM 8      Component Value Range   Sodium 142  135 - 145 mEq/L   Potassium 3.9  3.5 - 5.1 mEq/L   Chloride 109  96 - 112 mEq/L   BUN 21  6 - 23 mg/dL   Creatinine, Ser 0.98  0.50 - 1.10 mg/dL   Glucose, Bld 94  70 - 99 mg/dL   Calcium, Ion 1.19  1.47 - 1.23 mmol/L   TCO2 21  0 - 100 mmol/L   Hemoglobin 10.9 (*) 12.0 - 15.0 g/dL   HCT 82.9 (*) 56.2 - 13.0 %  POCT I-STAT TROPONIN I      Component Value Range   Troponin i, poc 0.00  0.00 - 0.08 ng/mL   Comment 3                 Date: 08/19/2012  Rate: 70   Rhythm: normal sinus rhythm  QRS Axis: normal  Intervals: normal  ST/T Wave abnormalities: normal  Conduction Disutrbances:none  Narrative Interpretation:   Old EKG Reviewed: none available   Dg Chest 2 View  08/19/2012  *RADIOLOGY REPORT*  Clinical Data: Chest pain and shortness of breath for 2 days.  CHEST - 2 VIEW  Comparison: None.  Findings: Slightly shallow inspiration with elevation of the right hemidiaphragm.  Mild atelectasis in the right lung base. The heart size and pulmonary vascularity are normal. The lungs appear clear and expanded without focal air space disease or consolidation. No blunting of the costophrenic angles.  No pneumothorax.  Mediastinal contours appear intact.  Surgical clips in the right upper quadrant.  IMPRESSION: Elevation right hemidiaphragm with mild atelectasis in the right lung base.   Original Report Authenticated By: Marlon Pel, M.D.      No diagnosis found.  1. Dyspnea, resolved.  MDM  The patient does not want to have the CT angio to r/o blood clots. She states she feels she needs to get home to her daughter. Discussed PE as possibly fatal condition, patient acknowledges, but states she feels better and wants to be home with her newborn.         Rodena Medin, PA-C 08/31/12 1048

## 2012-08-19 NOTE — ED Notes (Signed)
Report received, assumed care.  

## 2012-08-19 NOTE — ED Notes (Signed)
Pt with vaginal delivery 08/13/12.  She states that several hours after t-dap shot she began to wheeze and was given ventolin.  Since then she has had 2 episodes of chest tightness that wake her up from her sleep . . . As if she can't get a breath.  The feeling lasts for seconds.  SOB does not increase on inspiration.

## 2012-09-04 NOTE — ED Provider Notes (Signed)
Medical screening examination/treatment/procedure(s) were performed by non-physician practitioner and as supervising physician I was immediately available for consultation/collaboration.  Jasmine Awe, MD 09/04/12 1954

## 2014-06-18 ENCOUNTER — Emergency Department (HOSPITAL_COMMUNITY)
Admission: EM | Admit: 2014-06-18 | Discharge: 2014-06-18 | Discharge status: Against Medical Advice | Payer: Medicaid Other | Attending: Emergency Medicine | Admitting: Emergency Medicine

## 2014-06-18 ENCOUNTER — Encounter (HOSPITAL_COMMUNITY): Payer: Self-pay | Admitting: Emergency Medicine

## 2014-06-18 DIAGNOSIS — R21 Rash and other nonspecific skin eruption: Secondary | ICD-10-CM | POA: Diagnosis present

## 2014-06-18 NOTE — ED Notes (Signed)
Pt. Went up to speak to Registration and told them she had to leave.  Encouraged pt. To stay.

## 2014-06-18 NOTE — ED Notes (Addendum)
Presents with 10 years of intermittent rashes to extremities. THey come and go. She is concerned she has something contagious. Rash does not itch or burn. Two small red areas on left arm.. She has not seen a dermatologist. Rash on inner thighs as well.

## 2014-06-18 NOTE — ED Notes (Signed)
Pt requesting STD testing

## 2014-10-14 ENCOUNTER — Encounter (HOSPITAL_COMMUNITY): Payer: Self-pay | Admitting: Emergency Medicine

## 2015-01-30 ENCOUNTER — Inpatient Hospital Stay (HOSPITAL_COMMUNITY): Payer: Medicaid Other

## 2015-01-30 ENCOUNTER — Inpatient Hospital Stay (HOSPITAL_COMMUNITY)
Admission: AD | Admit: 2015-01-30 | Discharge: 2015-01-30 | Disposition: A | Discharge status: Home / Self Care | Payer: Medicaid Other | Source: Ambulatory Visit | Attending: Obstetrics & Gynecology | Admitting: Obstetrics & Gynecology

## 2015-01-30 ENCOUNTER — Encounter (HOSPITAL_COMMUNITY): Payer: Self-pay | Admitting: *Deleted

## 2015-01-30 DIAGNOSIS — Z3A01 Less than 8 weeks gestation of pregnancy: Secondary | ICD-10-CM | POA: Diagnosis not present

## 2015-01-30 DIAGNOSIS — O26851 Spotting complicating pregnancy, first trimester: Secondary | ICD-10-CM | POA: Insufficient documentation

## 2015-01-30 LAB — URINALYSIS, ROUTINE W REFLEX MICROSCOPIC
Bilirubin Urine: NEGATIVE
Glucose, UA: NEGATIVE mg/dL
Ketones, ur: NEGATIVE mg/dL
Leukocytes, UA: NEGATIVE
Nitrite: NEGATIVE
Protein, ur: NEGATIVE mg/dL
Specific Gravity, Urine: >1.03 — ABNORMAL HIGH (ref 1.005–1.030)
Urobilinogen, UA: 0.2 mg/dL (ref 0.0–1.0)
pH: 6 (ref 5.0–8.0)

## 2015-01-30 LAB — CBC
HCT: 38.9 % (ref 36.0–46.0)
Hemoglobin: 13.1 g/dL (ref 12.0–15.0)
MCH: 27.8 pg (ref 26.0–34.0)
MCHC: 33.7 g/dL (ref 30.0–36.0)
MCV: 82.4 fL (ref 78.0–100.0)
Platelets: 230 10*3/uL (ref 150–400)
RBC: 4.72 MIL/uL (ref 3.87–5.11)
RDW: 13.7 % (ref 11.5–15.5)
WBC: 8.4 10*3/uL (ref 4.0–10.5)

## 2015-01-30 LAB — URINE MICROSCOPIC-ADD ON

## 2015-01-30 LAB — HCG, QUANTITATIVE, PREGNANCY: hCG, Beta Chain, Quant, S: 43024 m[IU]/mL — ABNORMAL HIGH (ref <5)

## 2015-01-30 LAB — WET PREP, GENITAL
Clue Cells Wet Prep HPF POC: NONE SEEN
Trich, Wet Prep: NONE SEEN
Yeast Wet Prep HPF POC: NONE SEEN

## 2015-01-30 LAB — POCT PREGNANCY, URINE: Preg Test, Ur: POSITIVE — AB

## 2015-01-30 NOTE — MAU Note (Signed)
Spotting since 01/26, stopped 02/15.  +HPT 02/11. Brownish d/c on Wed.

## 2015-01-30 NOTE — Discharge Instructions (Signed)
Safe Medications in Pregnancy  ° °Acne: °Benzoyl Peroxide °Salicylic Acid ° °Backache/Headache: °Tylenol: 2 regular strength every 4 hours OR °             2 Extra strength every 6 hours ° °Colds/Coughs/Allergies: °Benadryl (alcohol free) 25 mg every 6 hours as needed °Breath right strips °Claritin °Cepacol throat lozenges °Chloraseptic throat spray °Cold-Eeze- up to three times per day °Cough drops, alcohol free °Flonase (by prescription only) °Guaifenesin °Mucinex °Robitussin DM (plain only, alcohol free) °Saline nasal spray/drops °Sudafed (pseudoephedrine) & Actifed ** use only after [redacted] weeks gestation and if you do not have high blood pressure °Tylenol °Vicks Vaporub °Zinc lozenges °Zyrtec  ° °Constipation: °Colace °Ducolax suppositories °Fleet enema °Glycerin suppositories °Metamucil °Milk of magnesia °Miralax °Senokot °Smooth move tea ° °Diarrhea: °Kaopectate °Imodium A-D ° °*NO pepto Bismol ° °Hemorrhoids: °Anusol °Anusol HC °Preparation H °Tucks ° °Indigestion: °Tums °Maalox °Mylanta °Zantac  °Pepcid ° °Insomnia: °Benadryl (alcohol free) 25mg every 6 hours as needed °Tylenol PM °Unisom, no Gelcaps ° °Leg Cramps: °Tums °MagGel ° °Nausea/Vomiting:  °Bonine °Dramamine °Emetrol °Ginger extract °Sea bands °Meclizine  °Nausea medication to take during pregnancy:  °Unisom (doxylamine succinate 25 mg tablets) Take one tablet daily at bedtime. If symptoms are not adequately controlled, the dose can be increased to a maximum recommended dose of two tablets daily (1/2 tablet in the morning, 1/2 tablet mid-afternoon and one at bedtime). °Vitamin B6 100mg tablets. Take one tablet twice a day (up to 200 mg per day). ° °Skin Rashes: °Aveeno products °Benadryl cream or 25mg every 6 hours as needed °Calamine Lotion °1% cortisone cream ° °Yeast infection: °Gyne-lotrimin 7 °Monistat 7 ° ° °**If taking multiple medications, please check labels to avoid duplicating the same active ingredients °**take medication as directed on  the label °** Do not exceed 4000 mg of tylenol in 24 hours °**Do not take medications that contain aspirin or ibuprofen ° ° ° °First Trimester of Pregnancy °The first trimester of pregnancy is from week 1 until the end of week 12 (months 1 through 3). A week after a sperm fertilizes an egg, the egg will implant on the wall of the uterus. This embryo will begin to develop into a baby. Genes from you and your partner are forming the baby. The female genes determine whether the baby is a boy or a girl. At 6-8 weeks, the eyes and face are formed, and the heartbeat can be seen on ultrasound. At the end of 12 weeks, all the baby's organs are formed.  °Now that you are pregnant, you will want to do everything you can to have a healthy baby. Two of the most important things are to get good prenatal care and to follow your health care provider's instructions. Prenatal care is all the medical care you receive before the baby's birth. This care will help prevent, find, and treat any problems during the pregnancy and childbirth. °BODY CHANGES °Your body goes through many changes during pregnancy. The changes vary from woman to woman.  °· You may gain or lose a couple of pounds at first. °· You may feel sick to your stomach (nauseous) and throw up (vomit). If the vomiting is uncontrollable, call your health care provider. °· You may tire easily. °· You may develop headaches that can be relieved by medicines approved by your health care provider. °· You may urinate more often. Painful urination may mean you have a bladder infection. °· You may develop heartburn as a result of your   pregnancy. °· You may develop constipation because certain hormones are causing the muscles that push waste through your intestines to slow down. °· You may develop hemorrhoids or swollen, bulging veins (varicose veins). °· Your breasts may begin to grow larger and become tender. Your nipples may stick out more, and the tissue that surrounds them (areola)  may become darker. °· Your gums may bleed and may be sensitive to brushing and flossing. °· Dark spots or blotches (chloasma, mask of pregnancy) may develop on your face. This will likely fade after the baby is born. °· Your menstrual periods will stop. °· You may have a loss of appetite. °· You may develop cravings for certain kinds of food. °· You may have changes in your emotions from day to day, such as being excited to be pregnant or being concerned that something may go wrong with the pregnancy and baby. °· You may have more vivid and strange dreams. °· You may have changes in your hair. These can include thickening of your hair, rapid growth, and changes in texture. Some women also have hair loss during or after pregnancy, or hair that feels dry or thin. Your hair will most likely return to normal after your baby is born. °WHAT TO EXPECT AT YOUR PRENATAL VISITS °During a routine prenatal visit: °· You will be weighed to make sure you and the baby are growing normally. °· Your blood pressure will be taken. °· Your abdomen will be measured to track your baby's growth. °· The fetal heartbeat will be listened to starting around week 10 or 12 of your pregnancy. °· Test results from any previous visits will be discussed. °Your health care provider may ask you: °· How you are feeling. °· If you are feeling the baby move. °· If you have had any abnormal symptoms, such as leaking fluid, bleeding, severe headaches, or abdominal cramping. °· If you have any questions. °Other tests that may be performed during your first trimester include: °· Blood tests to find your blood type and to check for the presence of any previous infections. They will also be used to check for low iron levels (anemia) and Rh antibodies. Later in the pregnancy, blood tests for diabetes will be done along with other tests if problems develop. °· Urine tests to check for infections, diabetes, or protein in the urine. °· An ultrasound to confirm  the proper growth and development of the baby. °· An amniocentesis to check for possible genetic problems. °· Fetal screens for spina bifida and Down syndrome. °· You may need other tests to make sure you and the baby are doing well. °HOME CARE INSTRUCTIONS  °Medicines °· Follow your health care provider's instructions regarding medicine use. Specific medicines may be either safe or unsafe to take during pregnancy. °· Take your prenatal vitamins as directed. °· If you develop constipation, try taking a stool softener if your health care provider approves. °Diet °· Eat regular, well-balanced meals. Choose a variety of foods, such as meat or vegetable-based protein, fish, milk and low-fat dairy products, vegetables, fruits, and whole grain breads and cereals. Your health care provider will help you determine the amount of weight gain that is right for you. °· Avoid raw meat and uncooked cheese. These carry germs that can cause birth defects in the baby. °· Eating four or five small meals rather than three large meals a day may help relieve nausea and vomiting. If you start to feel nauseous, eating a few soda crackers   can be helpful. Drinking liquids between meals instead of during meals also seems to help nausea and vomiting. °· If you develop constipation, eat more high-fiber foods, such as fresh vegetables or fruit and whole grains. Drink enough fluids to keep your urine clear or pale yellow. °Activity and Exercise °· Exercise only as directed by your health care provider. Exercising will help you: °¨ Control your weight. °¨ Stay in shape. °¨ Be prepared for labor and delivery. °· Experiencing pain or cramping in the lower abdomen or low back is a good sign that you should stop exercising. Check with your health care provider before continuing normal exercises. °· Try to avoid standing for long periods of time. Move your legs often if you must stand in one place for a long time. °· Avoid heavy lifting. °· Wear  low-heeled shoes, and practice good posture. °· You may continue to have sex unless your health care provider directs you otherwise. °Relief of Pain or Discomfort °· Wear a good support bra for breast tenderness.   °· Take warm sitz baths to soothe any pain or discomfort caused by hemorrhoids. Use hemorrhoid cream if your health care provider approves.   °· Rest with your legs elevated if you have leg cramps or low back pain. °· If you develop varicose veins in your legs, wear support hose. Elevate your feet for 15 minutes, 3-4 times a day. Limit salt in your diet. °Prenatal Care °· Schedule your prenatal visits by the twelfth week of pregnancy. They are usually scheduled monthly at first, then more often in the last 2 months before delivery. °· Write down your questions. Take them to your prenatal visits. °· Keep all your prenatal visits as directed by your health care provider. °Safety °· Wear your seat belt at all times when driving. °· Make a list of emergency phone numbers, including numbers for family, friends, the hospital, and police and fire departments. °General Tips °· Ask your health care provider for a referral to a local prenatal education class. Begin classes no later than at the beginning of month 6 of your pregnancy. °· Ask for help if you have counseling or nutritional needs during pregnancy. Your health care provider can offer advice or refer you to specialists for help with various needs. °· Do not use hot tubs, steam rooms, or saunas. °· Do not douche or use tampons or scented sanitary pads. °· Do not cross your legs for long periods of time. °· Avoid cat litter boxes and soil used by cats. These carry germs that can cause birth defects in the baby and possibly loss of the fetus by miscarriage or stillbirth. °· Avoid all smoking, herbs, alcohol, and medicines not prescribed by your health care provider. Chemicals in these affect the formation and growth of the baby. °· Schedule a dentist  appointment. At home, brush your teeth with a soft toothbrush and be gentle when you floss. °SEEK MEDICAL CARE IF:  °· You have dizziness. °· You have mild pelvic cramps, pelvic pressure, or nagging pain in the abdominal area. °· You have persistent nausea, vomiting, or diarrhea. °· You have a bad smelling vaginal discharge. °· You have pain with urination. °· You notice increased swelling in your face, hands, legs, or ankles. °SEEK IMMEDIATE MEDICAL CARE IF:  °· You have a fever. °· You are leaking fluid from your vagina. °· You have spotting or bleeding from your vagina. °· You have severe abdominal cramping or pain. °· You have rapid weight gain   or loss. °· You vomit blood or material that looks like coffee grounds. °· You are exposed to German measles and have never had them. °· You are exposed to fifth disease or chickenpox. °· You develop a severe headache. °· You have shortness of breath. °· You have any kind of trauma, such as from a fall or a car accident. °Document Released: 11/23/2001 Document Revised: 04/15/2014 Document Reviewed: 10/09/2013 °Exit[employer]® Patient Information ©2015 [employer]. This information is not intended to replace advice given to you by your health care provider. Make sure you discuss any questions you have with your health care provider. ° °

## 2015-01-30 NOTE — MAU Provider Note (Signed)
History     CSN: [id number]  Arrival date and time: 01/30/15 1322   First Provider Initiated Contact with Patient 01/30/15 1408      Chief Complaint  Patient presents with  . Possible Pregnancy  . Vaginal Bleeding   HPI  Catherine Edwards is a 39 y.o. [id number] at [redacted]w[redacted]d who presents today with spotting. She states that her LMP was 12/06/14, and she started spotting on 01/07/15. She states that her periods are irregular, and she did not take pregnancy test until last week. At that time it was positive. She has not started [facility] at this time.   Past Medical History  Diagnosis Date  . Asthma     Past Surgical History  Procedure Laterality Date  . Cholecystectomy      laproscopic  . Wisdom tooth extraction      Family History  Problem Relation Age of Onset  . Anesthesia problems Neg Hx   . Cancer Mother   . Cancer Maternal Grandmother     History  Substance Use Topics  . Smoking status: Never Smoker   . Smokeless tobacco: Never Used  . Alcohol Use: 0.6 oz/week    1 Shots of liquor per week     Comment: not since pregnancy    Allergies: No Known Allergies  Prescriptions prior to admission  Medication Sig Dispense Refill Last Dose  . acetaminophen (TYLENOL) 500 MG tablet Take 1,000 mg by mouth every 6 (six) hours as needed for mild pain or moderate pain.   Past Month at Unknown time  . Prenatal Vit-Fe Fumarate-FA (PRENATAL MULTIVITAMIN) TABS Take 1 tablet by mouth every morning.    01/30/2015 at Unknown time  . albuterol (PROVENTIL HFA;VENTOLIN HFA) 108 (90 BASE) MCG/ACT inhaler Inhale 2 puffs into the lungs every 6 (six) hours as needed. Wheezing or shortness of breath   08/19/2012 at Unknown    ROS Physical Exam   Blood pressure 154/86, pulse 78, temperature 98.8 F (37.1 C), temperature source Oral, resp. rate 18, height  (1.626 m), weight 112.038 kg (247 lb), last menstrual period 12/06/2014, currently breastfeeding.  Physical Exam  Nursing note and vitals  reviewed. Constitutional: She is oriented to person, place, and time. She appears well-developed and well-nourished. No distress.  Cardiovascular: Normal rate.   Respiratory: Effort normal.  GI: Soft. There is no tenderness. There is no rebound.  Genitourinary:   External: no lesion Vagina: small amount of white discharge Cervix: pink, smooth, no CMT Uterus: NSSC Adnexa: NT   Neurological: She is alert and oriented to person, place, and time.  Skin: Skin is warm and dry.  Psychiatric: She has a normal mood and affect.    MAU Course  Procedures Results for orders placed or performed during the hospital encounter of 01/30/15 (from the past 24 hour(s))  Urinalysis, Routine w reflex microscopic     Status: Abnormal   Collection Time: 01/30/15  1:47 PM  Result Value Ref Range   Color, Urine YELLOW YELLOW   APPearance CLEAR CLEAR   Specific Gravity, Urine >1.030 (H) 1.005 - 1.030   pH 6.0 5.0 - 8.0   Glucose, UA NEGATIVE NEGATIVE mg/dL   Hgb urine dipstick MODERATE (A) NEGATIVE   Bilirubin Urine NEGATIVE NEGATIVE   Ketones, ur NEGATIVE NEGATIVE mg/dL   Protein, ur NEGATIVE NEGATIVE mg/dL   Urobilinogen, UA 0.2 0.0 - 1.0 mg/dL   Nitrite NEGATIVE NEGATIVE   Leukocytes, UA NEGATIVE NEGATIVE  Urine microscopic-add on     Status: Abnormal  Collection Time: 01/30/15  1:47 PM  Result Value Ref Range   Squamous Epithelial / LPF FEW (A) RARE   WBC, UA 0-2 <3 WBC/hpf   RBC / HPF 3-6 <3 RBC/hpf   Bacteria, UA FEW (A) RARE  Pregnancy, urine POC     Status: Abnormal   Collection Time: 01/30/15  1:48 PM  Result Value Ref Range   Preg Test, Ur POSITIVE (A) NEGATIVE  CBC     Status: None   Collection Time: 01/30/15  2:00 PM  Result Value Ref Range   WBC 8.4 4.0 - 10.5 K/uL   RBC 4.72 3.87 - 5.11 MIL/uL   Hemoglobin 13.1 12.0 - 15.0 g/dL   HCT 16.1 09.6 - 04.5 %   MCV 82.4 78.0 - 100.0 fL   MCH 27.8 26.0 - 34.0 pg   MCHC 33.7 30.0 - 36.0 g/dL   RDW 40.9 81.1 - 91.4 %   Platelets  230 150 - 400 K/uL  Wet prep, genital     Status: Abnormal   Collection Time: 01/30/15  2:10 PM  Result Value Ref Range   Yeast Wet Prep HPF POC NONE SEEN NONE SEEN   Trich, Wet Prep NONE SEEN NONE SEEN   Clue Cells Wet Prep HPF POC NONE SEEN NONE SEEN   WBC, Wet Prep HPF POC MODERATE (A) NONE SEEN   US Ob Comp Less 14 Wks  01/30/2015   CLINICAL DATA:  Spotting. Gravida 8 para 1. Quantitative beta HCG is pending. Certain LMP is 12/06/2014. Gestational age by LMP is 7 weeks 6 days. EDC by LMP is 09/12/2015.  EXAM: OBSTETRIC <14 WK [place] AND TRANSVAGINAL OB US  TECHNIQUE: Both transabdominal and transvaginal ultrasound examinations were performed for complete evaluation of the gestation as well as the maternal uterus, adnexal regions, and pelvic cul-de-sac. Transvaginal technique was performed to assess early pregnancy.  COMPARISON:  None applicable  FINDINGS: Intrauterine gestational sac: Visualized/normal in shape.  Yolk sac:  Present  Embryo:  Present  Cardiac Activity: Present  Heart Rate: 146  bpm  CRL:  6.6  mm   6 w   4 d                  [place] EDC: 09/21/2015  Maternal uterus/adnexae: Small subchorionic hemorrhage is identified. Right ovary is not seen. Left corpus luteum cyst is 2.5 x 2.3 x 2.1 cm. No free pelvic fluid identified.  IMPRESSION: 1. Single living intrauterine embryo at 6 weeks 4 days by ultrasound. 2. Ultrasound dating is different compared to LMP and therefore followup is recommended in 2 weeks to confirm appropriate growth and for dating purposes.   Electronically Signed   By: Norva Pavlov M.D.   On: 01/30/2015 15:33   US Ob Transvaginal  01/30/2015   CLINICAL DATA:  Spotting. Gravida 8 para 1. Quantitative beta HCG is pending. Certain LMP is 12/06/2014. Gestational age by LMP is 7 weeks 6 days. EDC by LMP is 09/12/2015.  EXAM: OBSTETRIC <14 WK [place] AND TRANSVAGINAL OB US  TECHNIQUE: Both transabdominal and transvaginal ultrasound examinations were performed for complete evaluation of  the gestation as well as the maternal uterus, adnexal regions, and pelvic cul-de-sac. Transvaginal technique was performed to assess early pregnancy.  COMPARISON:  None applicable  FINDINGS: Intrauterine gestational sac: Visualized/normal in shape.  Yolk sac:  Present  Embryo:  Present  Cardiac Activity: Present  Heart Rate: 146  bpm  CRL:  6.6  mm   6 w  4 d                  US EDC: 09/21/2015  Maternal uterus/adnexae: Small subchorionic hemorrhage is identified. Right ovary is not seen. Left corpus luteum cyst is 2.5 x 2.3 x 2.1 cm. No free pelvic fluid identified.  IMPRESSION: 1. Single living intrauterine embryo at 6 weeks 4 days by ultrasound. 2. Ultrasound dating is different compared to LMP and therefore followup is recommended in 2 weeks to confirm appropriate growth and for dating purposes.   Electronically Signed   By: Norva PavlovElizabeth  Brown M.D.   On: 01/30/2015 15:33     Assessment and Plan   1. Spotting affecting pregnancy in first trimester    DC home First trimester precautions [facility]  Follow-up Information    [facility] [place].   Contact information:   [address] [facility] [place] [id number] [phone]       Tawnya CrookHogan, Aikeem Lilley Donovan 01/30/2015, 2:56 PM

## 2015-01-31 LAB — GC/CHLAMYDIA PROBE AMP (~~LOC~~) NOT AT ARMC
Chlamydia: NEGATIVE
Neisseria Gonorrhea: NEGATIVE

## 2015-01-31 LAB — HIV ANTIBODY (ROUTINE TESTING W REFLEX): HIV Screen 4th Generation wRfx: NONREACTIVE

## 2015-03-17 LAB — OB RESULTS CONSOLE HIV ANTIBODY (ROUTINE TESTING): HIV: NONREACTIVE

## 2015-03-17 LAB — OB RESULTS CONSOLE RUBELLA ANTIBODY, IGM: Rubella: IMMUNE

## 2015-03-17 LAB — OB RESULTS CONSOLE GC/CHLAMYDIA
Chlamydia: NEGATIVE
Gonorrhea: NEGATIVE

## 2015-03-17 LAB — OB RESULTS CONSOLE HEPATITIS B SURFACE ANTIGEN: Hepatitis B Surface Ag: NEGATIVE

## 2015-03-17 LAB — OB RESULTS CONSOLE ANTIBODY SCREEN: Antibody Screen: NEGATIVE

## 2015-03-17 LAB — OB RESULTS CONSOLE RPR: RPR: NONREACTIVE

## 2015-03-17 LAB — OB RESULTS CONSOLE ABO/RH: RH Type: POSITIVE

## 2015-04-14 ENCOUNTER — Other Ambulatory Visit (HOSPITAL_COMMUNITY): Payer: Self-pay | Admitting: Urology

## 2015-04-14 ENCOUNTER — Other Ambulatory Visit (HOSPITAL_COMMUNITY): Payer: Self-pay | Admitting: *Deleted

## 2015-04-14 ENCOUNTER — Other Ambulatory Visit (HOSPITAL_COMMUNITY): Payer: Self-pay | Admitting: Orthopedic Surgery

## 2015-04-14 DIAGNOSIS — Z3689 Encounter for other specified antenatal screening: Secondary | ICD-10-CM

## 2015-04-30 ENCOUNTER — Ambulatory Visit (HOSPITAL_COMMUNITY)
Admission: RE | Admit: 2015-04-30 | Discharge: 2015-04-30 | Disposition: A | Discharge status: Home / Self Care | Payer: Medicaid Other | Source: Ambulatory Visit | Attending: Orthopedic Surgery | Admitting: Orthopedic Surgery

## 2015-04-30 DIAGNOSIS — O09522 Supervision of elderly multigravida, second trimester: Secondary | ICD-10-CM | POA: Insufficient documentation

## 2015-04-30 DIAGNOSIS — O2622 Pregnancy care for patient with recurrent pregnancy loss, second trimester: Secondary | ICD-10-CM | POA: Insufficient documentation

## 2015-04-30 DIAGNOSIS — Z36 Encounter for antenatal screening of mother: Secondary | ICD-10-CM | POA: Diagnosis not present

## 2015-04-30 DIAGNOSIS — Z3A19 19 weeks gestation of pregnancy: Secondary | ICD-10-CM | POA: Diagnosis not present

## 2015-04-30 DIAGNOSIS — Z3689 Encounter for other specified antenatal screening: Secondary | ICD-10-CM | POA: Insufficient documentation

## 2015-05-05 ENCOUNTER — Other Ambulatory Visit (HOSPITAL_COMMUNITY): Payer: Self-pay | Admitting: Physician Assistant

## 2015-05-05 DIAGNOSIS — Z0489 Encounter for examination and observation for other specified reasons: Secondary | ICD-10-CM

## 2015-05-05 DIAGNOSIS — IMO0002 Reserved for concepts with insufficient information to code with codable children: Secondary | ICD-10-CM

## 2015-06-11 ENCOUNTER — Ambulatory Visit (HOSPITAL_COMMUNITY): Payer: Medicaid Other

## 2015-06-11 ENCOUNTER — Ambulatory Visit (HOSPITAL_COMMUNITY): Admission: RE | Admit: 2015-06-11 | Payer: Medicaid Other | Source: Ambulatory Visit

## 2015-06-23 ENCOUNTER — Encounter (HOSPITAL_COMMUNITY): Payer: Self-pay | Admitting: *Deleted

## 2015-06-23 ENCOUNTER — Inpatient Hospital Stay (HOSPITAL_COMMUNITY)
Admission: EM | Admit: 2015-06-23 | Discharge: 2015-06-23 | Disposition: A | Discharge status: Home / Self Care | Payer: Medicaid Other | Source: Ambulatory Visit | Attending: Obstetrics & Gynecology | Admitting: Obstetrics & Gynecology

## 2015-06-23 DIAGNOSIS — O26899 Other specified pregnancy related conditions, unspecified trimester: Secondary | ICD-10-CM

## 2015-06-23 DIAGNOSIS — O9989 Other specified diseases and conditions complicating pregnancy, childbirth and the puerperium: Secondary | ICD-10-CM

## 2015-06-23 DIAGNOSIS — R109 Unspecified abdominal pain: Secondary | ICD-10-CM | POA: Diagnosis not present

## 2015-06-23 DIAGNOSIS — Z3A27 27 weeks gestation of pregnancy: Secondary | ICD-10-CM | POA: Diagnosis not present

## 2015-06-23 HISTORY — DX: Obesity, unspecified: E66.9

## 2015-06-23 HISTORY — DX: Recurrent pregnancy loss: N96

## 2015-06-23 LAB — URINALYSIS, ROUTINE W REFLEX MICROSCOPIC
Bilirubin Urine: NEGATIVE
Glucose, UA: NEGATIVE mg/dL
Ketones, ur: NEGATIVE mg/dL
Leukocytes, UA: NEGATIVE
Nitrite: NEGATIVE
Protein, ur: NEGATIVE mg/dL
Specific Gravity, Urine: 1.025 (ref 1.005–1.030)
Urobilinogen, UA: 0.2 mg/dL (ref 0.0–1.0)
pH: 6 (ref 5.0–8.0)

## 2015-06-23 LAB — URINE MICROSCOPIC-ADD ON

## 2015-06-23 NOTE — Discharge Instructions (Signed)

## 2015-06-23 NOTE — MAU Note (Signed)
States has been having contractions for several weeks, but seem to be more now. States she was advised at health dept to drink more water. States she has done that and it did not make a difference. Was not examined at health dept. No bleeding or leaking.

## 2015-06-23 NOTE — MAU Provider Note (Signed)
History     CSN: [id number]  Arrival date and time: 06/23/15 7829   First Provider Initiated Contact with Patient 06/23/15 0818      Chief Complaint  Patient presents with  . Contractions   HPI   Ms. Catherine Edwards is a 39 y.o. female [phone] at [redacted]w[redacted]d presenting to MAU with contractions. The contractions have been off and on for 3 weeks. At times the contractions are painful and other times it feels like menstrual cramping. She is having very minimal pain at this time.   She has a history of multiple miscarriages.   Denies vaginal bleeding, or leaking of fluid.  + fetal movement.   OB History    Gravida Para Term Preterm AB TAB SAB Ectopic Multiple Living   Past Medical History  Diagnosis Date  . Asthma   . History of multiple miscarriages   . Obesity     Past Surgical History  Procedure Laterality Date  . Cholecystectomy      laproscopic  . Wisdom tooth extraction      Family History  Problem Relation Age of Onset  . Anesthesia problems Neg Hx   . Cancer Mother   . Cancer Maternal Grandmother     History  Substance Use Topics  . Smoking status: Never Smoker   . Smokeless tobacco: Never Used  . Alcohol Use: 0.6 oz/week    1 Shots of liquor per week     Comment: not since pregnancy    Allergies: No Known Allergies  Prescriptions prior to admission  Medication Sig Dispense Refill Last Dose  . acetaminophen (TYLENOL) 500 MG tablet Take 1,000 mg by mouth every 6 (six) hours as needed for mild pain or moderate pain.   Past Week at Unknown time  . albuterol (PROVENTIL HFA;VENTOLIN HFA) 108 (90 BASE) MCG/ACT inhaler Inhale 2 puffs into the lungs every 6 (six) hours as needed. Wheezing or shortness of breath   06/23/2015 at Unknown time  . Prenatal Vit-Fe Fumarate-FA (PRENATAL MULTIVITAMIN) TABS Take 1 tablet by mouth every morning.    06/22/2015 at Unknown time   Results for orders placed or performed during the hospital encounter of  06/23/15 (from the past 48 hour(s))  Urinalysis, Routine w reflex microscopic (not at [facility])     Status: Abnormal   Collection Time: 06/23/15  7:27 AM  Result Value Ref Range   Color, Urine YELLOW YELLOW   APPearance CLEAR CLEAR   Specific Gravity, Urine 1.025 1.005 - 1.030   pH 6.0 5.0 - 8.0   Glucose, UA NEGATIVE NEGATIVE mg/dL   Hgb urine dipstick TRACE (A) NEGATIVE   Bilirubin Urine NEGATIVE NEGATIVE   Ketones, ur NEGATIVE NEGATIVE mg/dL   Protein, ur NEGATIVE NEGATIVE mg/dL   Urobilinogen, UA 0.2 0.0 - 1.0 mg/dL   Nitrite NEGATIVE NEGATIVE   Leukocytes, UA NEGATIVE NEGATIVE  Urine microscopic-add on     Status: Abnormal   Collection Time: 06/23/15  7:27 AM  Result Value Ref Range   Squamous Epithelial / LPF FEW (A) RARE   WBC, UA 0-2 <3 WBC/hpf   RBC / HPF 0-2 <3 RBC/hpf   Bacteria, UA FEW (A) RARE    Review of Systems  Constitutional: Negative for fever and chills.  Gastrointestinal: Positive for abdominal pain (Occasional cramping ). Negative for nausea and vomiting.  Genitourinary: Negative for dysuria and urgency.   Physical Exam   Blood pressure  131/71, pulse 95, temperature 98.5 F (36.9 C), temperature source Oral, resp. rate 18, last menstrual period 12/06/2014, currently breastfeeding.  Physical Exam  Constitutional: She is oriented to person, place, and time. She appears well-developed and well-nourished. No distress.  HENT:  Head: Normocephalic.  Eyes: Pupils are equal, round, and reactive to light.  Respiratory: Effort normal.  GI: Soft. There is no tenderness.  Genitourinary:  Dilation: Closed Effacement (%): Thick Exam by:: Blanche EastJ. Sariah Henkin, NP  Neurological: She is alert and oriented to person, place, and time.  Skin: Skin is warm. She is not diaphoretic.  Psychiatric: Her behavior is normal.   Fetal Tracing: Baseline: 130 bpm  Variability: moderate  Accelerations: 15x15 Decelerations: none Toco: quiet   MAU Course  Procedures   None  MDM  Ua  Assessment and Plan   A:  1. Abdominal cramping affecting pregnancy    P:  Discharge home in stable condition Increase PO fluid intake Pregnancy support belt recommended.  Return to MAU if symptoms worsen Preterm labor precautions [facility]   Duane LopeJennifer I Shandi Godfrey, NP 06/23/2015 8:18 AM

## 2015-07-03 ENCOUNTER — Ambulatory Visit (HOSPITAL_COMMUNITY)
Admission: RE | Admit: 2015-07-03 | Discharge: 2015-07-03 | Disposition: A | Discharge status: Home / Self Care | Payer: Medicaid Other | Source: Ambulatory Visit | Attending: Physician Assistant | Admitting: Physician Assistant

## 2015-07-03 DIAGNOSIS — Z3A30 30 weeks gestation of pregnancy: Secondary | ICD-10-CM | POA: Insufficient documentation

## 2015-07-03 DIAGNOSIS — O2620 Pregnancy care for patient with recurrent pregnancy loss, unspecified trimester: Secondary | ICD-10-CM | POA: Diagnosis not present

## 2015-07-03 DIAGNOSIS — O09529 Supervision of elderly multigravida, unspecified trimester: Secondary | ICD-10-CM | POA: Insufficient documentation

## 2015-07-03 DIAGNOSIS — O09522 Supervision of elderly multigravida, second trimester: Secondary | ICD-10-CM | POA: Insufficient documentation

## 2015-07-03 DIAGNOSIS — O26849 Uterine size-date discrepancy, unspecified trimester: Secondary | ICD-10-CM | POA: Insufficient documentation

## 2015-08-13 ENCOUNTER — Other Ambulatory Visit (HOSPITAL_COMMUNITY): Payer: Self-pay | Admitting: Nurse Practitioner

## 2015-08-13 DIAGNOSIS — Z3689 Encounter for other specified antenatal screening: Secondary | ICD-10-CM

## 2015-08-13 DIAGNOSIS — Z3A38 38 weeks gestation of pregnancy: Secondary | ICD-10-CM

## 2015-08-29 ENCOUNTER — Encounter (HOSPITAL_COMMUNITY): Payer: Self-pay | Admitting: *Deleted

## 2015-08-29 ENCOUNTER — Inpatient Hospital Stay (HOSPITAL_COMMUNITY)
Admission: AD | Admit: 2015-08-29 | Discharge: 2015-08-29 | Disposition: A | Discharge status: Home / Self Care | Payer: Medicaid Other | Source: Ambulatory Visit | Attending: Obstetrics and Gynecology | Admitting: Obstetrics and Gynecology

## 2015-08-29 DIAGNOSIS — O9989 Other specified diseases and conditions complicating pregnancy, childbirth and the puerperium: Secondary | ICD-10-CM | POA: Diagnosis not present

## 2015-08-29 DIAGNOSIS — O4693 Antepartum hemorrhage, unspecified, third trimester: Secondary | ICD-10-CM | POA: Diagnosis present

## 2015-08-29 DIAGNOSIS — Z3A36 36 weeks gestation of pregnancy: Secondary | ICD-10-CM | POA: Diagnosis not present

## 2015-08-29 DIAGNOSIS — O26853 Spotting complicating pregnancy, third trimester: Secondary | ICD-10-CM | POA: Diagnosis not present

## 2015-08-29 DIAGNOSIS — G44209 Tension-type headache, unspecified, not intractable: Secondary | ICD-10-CM

## 2015-08-29 LAB — URINALYSIS, ROUTINE W REFLEX MICROSCOPIC
Bilirubin Urine: NEGATIVE
Glucose, UA: NEGATIVE mg/dL
Ketones, ur: NEGATIVE mg/dL
Leukocytes, UA: NEGATIVE
Nitrite: NEGATIVE
Protein, ur: NEGATIVE mg/dL
Specific Gravity, Urine: 1.02 (ref 1.005–1.030)
Urobilinogen, UA: 0.2 mg/dL (ref 0.0–1.0)
pH: 7 (ref 5.0–8.0)

## 2015-08-29 LAB — COMPREHENSIVE METABOLIC PANEL
ALT: 32 U/L (ref 14–54)
AST: 21 U/L (ref 15–41)
Albumin: 2.9 g/dL — ABNORMAL LOW (ref 3.5–5.0)
Alkaline Phosphatase: 150 U/L — ABNORMAL HIGH (ref 38–126)
Anion gap: 7 (ref 5–15)
BUN: 11 mg/dL (ref 6–20)
CO2: 22 mmol/L (ref 22–32)
Calcium: 9.1 mg/dL (ref 8.9–10.3)
Chloride: 106 mmol/L (ref 101–111)
Creatinine, Ser: 0.52 mg/dL (ref 0.44–1.00)
GFR calc Af Amer: >60 mL/min (ref >60)
GFR calc non Af Amer: >60 mL/min (ref >60)
Glucose, Bld: 89 mg/dL (ref 65–99)
Potassium: 4.1 mmol/L (ref 3.5–5.1)
Sodium: 135 mmol/L (ref 135–145)
Total Bilirubin: 0.3 mg/dL (ref 0.3–1.2)
Total Protein: 6.5 g/dL (ref 6.5–8.1)

## 2015-08-29 LAB — CBC
HCT: 37.3 % (ref 36.0–46.0)
Hemoglobin: 12.3 g/dL (ref 12.0–15.0)
MCH: 27.8 pg (ref 26.0–34.0)
MCHC: 33 g/dL (ref 30.0–36.0)
MCV: 84.4 fL (ref 78.0–100.0)
Platelets: 223 10*3/uL (ref 150–400)
RBC: 4.42 MIL/uL (ref 3.87–5.11)
RDW: 15.8 % — ABNORMAL HIGH (ref 11.5–15.5)
WBC: 9.5 10*3/uL (ref 4.0–10.5)

## 2015-08-29 LAB — PROTEIN / CREATININE RATIO, URINE
Creatinine, Urine: 120 mg/dL
Protein Creatinine Ratio: 0.13 mg/mg{Cre} (ref 0.00–0.15)
Total Protein, Urine: 16 mg/dL

## 2015-08-29 LAB — URINE MICROSCOPIC-ADD ON

## 2015-08-29 MED ORDER — ACETAMINOPHEN 500 MG PO TABS
1000.0000 mg | ORAL_TABLET | Freq: Once | ORAL | Status: AC
  Administered 2015-08-29: 1000 mg via ORAL
  Filled 2015-08-29: qty 2

## 2015-08-29 NOTE — MAU Note (Signed)
Pt states here for spotting and headaches. Has intermittent headache, took tylenol. No blurred vision or spots before eyes. Gets light headed and dizzy. Concerned for pre-eclampsia.

## 2015-08-29 NOTE — MAU Provider Note (Signed)
MAU HISTORY AND PHYSICAL  Chief Complaint:  Vaginal Bleeding and Headache   Talullah Abate is a 39 y.o.  [phone] with IUP at [redacted]w[redacted]d presenting for Vaginal Bleeding and Headache  This afternoon some blood-tinged mucous. Occasional contraction for a few weeks. No leakage of fluid, feeling baby move.  Thinks may have had some elevated pressures at recent visits but on re-check has always been normal.   New headache yesterday and again today. Bilateral frontal. No weakness or numbness. No vision change or ruq pain. Saw hd physician yesterday, discussed the ha, BP not elevated, but given rpeeclampsia return precautions. Says lab tests recently for preeclmpsia was normal, though thinks may have had protein in urine.     Past Medical History  Diagnosis Date  . Asthma   . History of multiple miscarriages   . Obesity     Past Surgical History  Procedure Laterality Date  . Cholecystectomy      laproscopic  . Wisdom tooth extraction      Family History  Problem Relation Age of Onset  . Anesthesia problems Neg Hx   . Cancer Mother   . Cancer Maternal Grandmother     Social History  Substance Use Topics  . Smoking status: Never Smoker   . Smokeless tobacco: Never Used  . Alcohol Use: No     Comment: not since pregnancy    No Known Allergies  Prescriptions prior to admission  Medication Sig Dispense Refill Last Dose  . acetaminophen (TYLENOL) 500 MG tablet Take 1,000 mg by mouth every 6 (six) hours as needed for mild pain or moderate pain.   08/28/2015 at Unknown time  . albuterol (PROVENTIL HFA;VENTOLIN HFA) 108 (90 BASE) MCG/ACT inhaler Inhale 2 puffs into the lungs every 4 (four) hours as needed for wheezing or shortness of breath. Wheezing or shortness of breath   08/29/2015 at Unknown time  . calcium carbonate (TUMS - DOSED IN MG ELEMENTAL CALCIUM) 500 MG chewable tablet Chew 2 tablets by mouth daily as needed for indigestion or heartburn.   Past Week at Unknown time  .  Prenatal Vit-Fe Fumarate-FA (PRENATAL MULTIVITAMIN) TABS Take 1 tablet by mouth daily.    08/28/2015 at Unknown time    Review of Systems - Negative except for what is mentioned in HPI.  Physical Exam  Blood pressure 127/73, pulse 85, temperature 97.9 F (36.6 C), temperature source Oral, resp. rate 18, height  (1.676 m), weight 288 lb (130.636 kg), last menstrual period 12/06/2014, currently breastfeeding. GENERAL: Well-developed, well-nourished female in no acute distress. Obese LUNGS: Clear to auscultation bilaterally.  HEART: Regular rate and rhythm. ABDOMEN: Soft, nontender, nondistended, gravid.  EXTREMITIES: Nontender, no edema, 2+ distal pulses. Neuro: cn 2-12 grossly intact, no focal neurologic deficits GU: normal vagina and cervix, blood-streaked mucous at cervical os, no other blood, no pooling, 1.5/thick/high FHT:  145/mod/positive accels, negative decels Contractions quiet   Labs: Results for orders placed or performed during the hospital encounter of 08/29/15 (from the past 24 hour(s))  Urinalysis, Routine w reflex microscopic (not at [facility])   Collection Time: 08/29/15  5:50 PM  Result Value Ref Range   Color, Urine YELLOW YELLOW   APPearance CLEAR CLEAR   Specific Gravity, Urine 1.020 1.005 - 1.030   pH 7.0 5.0 - 8.0   Glucose, UA NEGATIVE NEGATIVE mg/dL   Hgb urine dipstick MODERATE (A) NEGATIVE   Bilirubin Urine NEGATIVE NEGATIVE   Ketones, ur NEGATIVE NEGATIVE mg/dL   Protein, ur NEGATIVE NEGATIVE mg/dL  Urobilinogen, UA 0.2 0.0 - 1.0 mg/dL   Nitrite NEGATIVE NEGATIVE   Leukocytes, UA NEGATIVE NEGATIVE  Urine microscopic-add on   Collection Time: 08/29/15  5:50 PM  Result Value Ref Range   Squamous Epithelial / LPF RARE RARE   WBC, UA 0-2 <3 WBC/hpf   RBC / HPF 3-6 <3 RBC/hpf   Bacteria, UA RARE RARE  Protein / creatinine ratio, urine   Collection Time: 08/29/15  5:50 PM  Result Value Ref Range   Creatinine, Urine 120.00 mg/dL   Total Protein, Urine  16 mg/dL   Protein Creatinine Ratio 0.13 0.00 - 0.15 mg/mg[Cre]  Comprehensive metabolic panel   Collection Time: 08/29/15  7:15 PM  Result Value Ref Range   Sodium 135 135 - 145 mmol/L   Potassium 4.1 3.5 - 5.1 mmol/L   Chloride 106 101 - 111 mmol/L   CO2 22 22 - 32 mmol/L   Glucose, Bld 89 65 - 99 mg/dL   BUN 11 6 - 20 mg/dL   Creatinine, Ser 1.61 0.44 - 1.00 mg/dL   Calcium 9.1 8.9 - 09.6 mg/dL   Total Protein 6.5 6.5 - 8.1 g/dL   Albumin 2.9 (L) 3.5 - 5.0 g/dL   AST 21 15 - 41 U/L   ALT 32 14 - 54 U/L   Alkaline Phosphatase 150 (H) 38 - 126 U/L   Total Bilirubin 0.3 0.3 - 1.2 mg/dL   GFR calc non Af Amer >60 >60 mL/min   GFR calc Af Amer >60 >60 mL/min   Anion gap 7 5 - 15  CBC   Collection Time: 08/29/15  7:15 PM  Result Value Ref Range   WBC 9.5 4.0 - 10.5 K/uL   RBC 4.42 3.87 - 5.11 MIL/uL   Hemoglobin 12.3 12.0 - 15.0 g/dL   HCT 04.5 40.9 - 81.1 %   MCV 84.4 78.0 - 100.0 fL   MCH 27.8 26.0 - 34.0 pg   MCHC 33.0 30.0 - 36.0 g/dL   RDW 91.4 (H) 78.2 - 95.6 %   Platelets 223 150 - 400 K/uL    Imaging Studies:  No results found.  Assessment: English Tomer is  39 y.o. [phone] at [redacted]w[redacted]d presents with spotting and concern for preeclampsia. Apparently has had some initial elevated BPs recently at prenatal provider that normalize but have led prenatal provider to w/u preeclampsia, which patient says has resultted in negative w/u. However, has been given preeclampsia return precautions, and as had new ha today sought medical care. Here bp wnl and normal on repeat one hour later. HA resolved with tylenol. No other symptoms of severe disease. Laboratory w/u for preeclampsia negative. Speculum exam showed blood-tinged mucous, otherwise normal - think this likely early cervical change, do not think in labor as no regular contractions.  Plan: - home with ptl, pprom, and abruption return precautions - preeclampsia return precautions - HD f/u next week as scheduled  Silvano Bilis 9/16/20168:20 PM  c

## 2015-08-31 ENCOUNTER — Encounter (HOSPITAL_COMMUNITY): Payer: Self-pay

## 2015-08-31 ENCOUNTER — Inpatient Hospital Stay (HOSPITAL_COMMUNITY)
Admission: AD | Admit: 2015-08-31 | Discharge: 2015-09-02 | DRG: 775 | Disposition: A | Discharge status: Home / Self Care | Payer: Medicaid Other | Source: Ambulatory Visit | Attending: Family Medicine | Admitting: Family Medicine

## 2015-08-31 DIAGNOSIS — O09523 Supervision of elderly multigravida, third trimester: Secondary | ICD-10-CM | POA: Diagnosis not present

## 2015-08-31 DIAGNOSIS — O321XX0 Maternal care for breech presentation, not applicable or unspecified: Secondary | ICD-10-CM | POA: Diagnosis not present

## 2015-08-31 DIAGNOSIS — O3663X0 Maternal care for excessive fetal growth, third trimester, not applicable or unspecified: Secondary | ICD-10-CM | POA: Diagnosis not present

## 2015-08-31 DIAGNOSIS — O43123 Velamentous insertion of umbilical cord, third trimester: Secondary | ICD-10-CM | POA: Diagnosis not present

## 2015-08-31 DIAGNOSIS — IMO0001 Reserved for inherently not codable concepts without codable children: Secondary | ICD-10-CM

## 2015-08-31 DIAGNOSIS — O26843 Uterine size-date discrepancy, third trimester: Secondary | ICD-10-CM | POA: Diagnosis present

## 2015-08-31 DIAGNOSIS — Z3A37 37 weeks gestation of pregnancy: Secondary | ICD-10-CM

## 2015-08-31 LAB — CBC
HCT: 38.8 % (ref 36.0–46.0)
Hemoglobin: 13.2 g/dL (ref 12.0–15.0)
MCH: 28.1 pg (ref 26.0–34.0)
MCHC: 34 g/dL (ref 30.0–36.0)
MCV: 82.6 fL (ref 78.0–100.0)
Platelets: 239 10*3/uL (ref 150–400)
RBC: 4.7 MIL/uL (ref 3.87–5.11)
RDW: 15.8 % — ABNORMAL HIGH (ref 11.5–15.5)
WBC: 13.3 10*3/uL — ABNORMAL HIGH (ref 4.0–10.5)

## 2015-08-31 LAB — GROUP B STREP BY PCR: Group B strep by PCR: POSITIVE — AB

## 2015-08-31 LAB — TYPE AND SCREEN
ABO/RH(D): A POS
Antibody Screen: NEGATIVE

## 2015-08-31 LAB — OB RESULTS CONSOLE GBS: GBS: POSITIVE

## 2015-08-31 MED ORDER — ONDANSETRON HCL 4 MG/2ML IJ SOLN: 4.0000 mg | Freq: Four times a day (QID) | INTRAMUSCULAR | Status: DC | PRN

## 2015-08-31 MED ORDER — FENTANYL 2.5 MCG/ML BUPIVACAINE 1/10 % EPIDURAL INFUSION (WH - ANES): 14.0000 mL/h | INTRAMUSCULAR | Status: DC | PRN

## 2015-08-31 MED ORDER — DIPHENHYDRAMINE HCL 50 MG/ML IJ SOLN: 12.5000 mg | INTRAMUSCULAR | Status: DC | PRN

## 2015-08-31 MED ORDER — NALBUPHINE HCL 10 MG/ML IJ SOLN
10.0000 mg | INTRAMUSCULAR | Status: DC | PRN
  Administered 2015-08-31: 10 mg via INTRAVENOUS
  Filled 2015-08-31: qty 1

## 2015-08-31 MED ORDER — EPHEDRINE 5 MG/ML INJ
10.0000 mg | INTRAVENOUS | Status: DC | PRN
  Filled 2015-08-31: qty 2

## 2015-08-31 MED ORDER — CITRIC ACID-SODIUM CITRATE 334-500 MG/5ML PO SOLN: 30.0000 mL | ORAL | Status: DC | PRN

## 2015-08-31 MED ORDER — OXYTOCIN 40 UNITS IN LACTATED RINGERS INFUSION - SIMPLE MED
62.5000 mL/h | INTRAVENOUS | Status: DC
  Filled 2015-08-31 (×2): qty 1000

## 2015-08-31 MED ORDER — CEFAZOLIN SODIUM-DEXTROSE 2-3 GM-% IV SOLR
2.0000 g | Freq: Once | INTRAVENOUS | Status: AC
  Administered 2015-08-31: 2 g via INTRAVENOUS
  Filled 2015-08-31: qty 50

## 2015-08-31 MED ORDER — OXYTOCIN BOLUS FROM INFUSION
500.0000 mL | INTRAVENOUS | Status: DC
  Administered 2015-08-31 (×2): 500 mL via INTRAVENOUS

## 2015-08-31 MED ORDER — LACTATED RINGERS IV SOLN: 500.0000 mL | INTRAVENOUS | Status: DC | PRN

## 2015-08-31 MED ORDER — LACTATED RINGERS IV SOLN
INTRAVENOUS | Status: DC
  Administered 2015-08-31: 21:00:00 via INTRAVENOUS

## 2015-08-31 MED ORDER — PHENYLEPHRINE 40 MCG/ML (10ML) SYRINGE FOR IV PUSH (FOR BLOOD PRESSURE SUPPORT)
80.0000 ug | PREFILLED_SYRINGE | INTRAVENOUS | Status: DC | PRN
  Filled 2015-08-31: qty 2

## 2015-08-31 MED ORDER — LIDOCAINE HCL (PF) 1 % IJ SOLN
30.0000 mL | INTRAMUSCULAR | Status: DC | PRN
  Administered 2015-08-31: 30 mL via SUBCUTANEOUS
  Filled 2015-08-31: qty 30

## 2015-08-31 MED ORDER — OXYCODONE-ACETAMINOPHEN 5-325 MG PO TABS: 2.0000 | ORAL_TABLET | ORAL | Status: DC | PRN

## 2015-08-31 MED ORDER — ACETAMINOPHEN 325 MG PO TABS: 650.0000 mg | ORAL_TABLET | ORAL | Status: DC | PRN

## 2015-08-31 MED ORDER — OXYCODONE-ACETAMINOPHEN 5-325 MG PO TABS: 1.0000 | ORAL_TABLET | ORAL | Status: DC | PRN

## 2015-08-31 MED ORDER — IBUPROFEN 600 MG PO TABS
600.0000 mg | ORAL_TABLET | Freq: Four times a day (QID) | ORAL | Status: DC
Start: 2015-09-01 — End: 2015-09-02
  Administered 2015-09-01 – 2015-09-02 (×7): 600 mg via ORAL
  Filled 2015-08-31 (×7): qty 1

## 2015-08-31 NOTE — H&P (Signed)
OBSTETRIC ADMISSION HISTORY AND PHYSICAL  Catherine Edwards is a 39 y.o. female [phone] with IUP at [redacted]w[redacted]d by 1st trimester u/s presenting for SOL. She reports +FMs, No LOF, no VB, no blurry vision, headaches or peripheral edema, and RUQ pain.  She plans on breast feeding. She request nothing for birth control.  Dating: By 1st trimester u/s --->  Estimated Date of Delivery: 09/21/15  Sono:    , CWD, normal anatomy-limited views of lips, heart, and profile, marginal vs velamentous placental cord insertion, 342g, 58% EFW , CWD, normal anatomy-limited views of lips, heart, 1650g, 84% EFW  Prenatal History/Complications:  Past Medical History: Past Medical History  Diagnosis Date  . Asthma   . History of multiple miscarriages   . Obesity     Past Surgical History: Past Surgical History  Procedure Laterality Date  . Cholecystectomy      laproscopic  . Wisdom tooth extraction      Obstetrical History: OB History    Gravida Para Term Preterm AB TAB SAB Ectopic Multiple Living   Social History: Social History   Social History  . Marital Status: Single    Spouse Name: N/A  . Number of Children: N/A  . Years of Education: N/A   Social History Main Topics  . Smoking status: Never Smoker   . Smokeless tobacco: Never Used  . Alcohol Use: No     Comment: not since pregnancy  . Drug Use: No  . Sexual Activity: Yes    Birth Control/ Protection: None   Other Topics Concern  . None   Social History Narrative    Family History: Family History  Problem Relation Age of Onset  . Anesthesia problems Neg Hx   . Cancer Mother   . Cancer Maternal Grandmother     Allergies: No Known Allergies  Prescriptions prior to admission  Medication Sig Dispense Refill Last Dose  . acetaminophen (TYLENOL) 500 MG tablet Take 1,000 mg by mouth every 6 (six) hours as needed for mild pain or moderate pain.   08/28/2015 at Unknown time  . albuterol (PROVENTIL  HFA;VENTOLIN HFA) 108 (90 BASE) MCG/ACT inhaler Inhale 2 puffs into the lungs every 4 (four) hours as needed for wheezing or shortness of breath. Wheezing or shortness of breath   08/29/2015 at Unknown time  . calcium carbonate (TUMS - DOSED IN MG ELEMENTAL CALCIUM) 500 MG chewable tablet Chew 2 tablets by mouth daily as needed for indigestion or heartburn.   Past Week at Unknown time  . Prenatal Vit-Fe Fumarate-FA (PRENATAL MULTIVITAMIN) TABS Take 1 tablet by mouth daily.    08/28/2015 at Unknown time     Review of Systems   All systems reviewed and negative except as stated in HPI  Blood pressure 123/67, pulse 80, last menstrual period 12/06/2014, currently breastfeeding. General appearance: alert, cooperative and moderate distress Lungs: clear to auscultation bilaterally Heart: regular rate and rhythm Abdomen: soft, non-tender; bowel sounds normal Extremities: Homans sign is negative, no sign of DVT DTR's wnl Presentation: breech Fetal monitoringBaseline: 125 bpm, Variability: Good {> 6 bpm), Accelerations: Reactive and Decelerations: Absent Uterine activityNone and Date/time of onset: q39min  Dilation: 7.5 Effacement (%): 90 Station: -2 Exam by:: Zerita Boers,    Prenatal labs: ABO, Rh: A/Positive/-- (04/04 0000) Antibody: Negative (04/04 0000) Rubella:   negative RPR: Nonreactive (04/04 0000)  HBsAg: Negative (04/04 0000)  HIV: Non-reactive (04/04 0000)  GBS:  unknown 1 hr Glucola 109 Genetic screening: Hgb electrophoresis negative Anatomy US normal except limited views of lips, heart, and profile  Prenatal Transfer Tool  Maternal Diabetes: No Genetic Screening: Normal Maternal Ultrasounds/Referrals: Abnormal:  Findings:   Other: LGA, borderline low AFI Fetal Ultrasounds or other Referrals:  None Maternal Substance Abuse:  No Significant Maternal Medications:  None Significant Maternal Lab Results: None  No results found for this or any previous visit (from the  past 24 hour(s)).  Patient Active Problem List   Diagnosis Date Noted  . Active labor 08/31/2015  . Uterine size date discrepancy pregnancy   . [redacted] weeks gestation of pregnancy   . Maternal age 36+, multigravida, antepartum   . Screening, antenatal, for fetal anatomic survey   . [redacted] weeks gestation of pregnancy   . Abnormal maternal serum screening test 03/30/2012    Assessment: Catherine Edwards is a 39 y.o. [id number] at [redacted]w[redacted]d here for SOL  #Labor: Breech, meconium with AROM, discussed VD vs CS, reviewed risk, benefits, and alternatives #Pain: Epidural upon maternal request #FWB: Category 1 tracing,  #ID:  GBS unknown #MOF: breast #MOC: declines #Circ:  Outpatient, will need list of places to get them  Catherine Edwards 08/31/2015, 8:59 PM

## 2015-08-31 NOTE — MAU Note (Signed)
Pt presents via EMS complaining of contractions and pressure. Denies leaking.

## 2015-08-31 NOTE — Progress Notes (Signed)
Patient admitted and ROM, showed copious meconium stained fluid.  Found to be breech and 9 cm. Had 2 prior SVD's. Counseled and elected for breech delivery. Pushed with 4 contractions with good descent. Once butt and feet were out, infant moved to prone position and delivered arms and head easily to mom's chest. Spontaneous cry heard. After delay cord clamped x 2 and cut by FOB. Remainder of delivery by resident and Marlynn Perking, CNM.

## 2015-08-31 NOTE — Progress Notes (Signed)
Patient ID: Catherine Edwards, female   DOB: 02-07-1976, 39 y.o.   MRN: [id number] Manual extraction of placenta per Dr. Shawnie Pons. EBL 455cc. Pt tolerated procedure well. Fundus firm at umbilicus with sm to mod amt of lochia.

## 2015-09-01 MED ORDER — DIPHENHYDRAMINE HCL 25 MG PO CAPS: 25.0000 mg | ORAL_CAPSULE | Freq: Four times a day (QID) | ORAL | Status: DC | PRN

## 2015-09-01 MED ORDER — SIMETHICONE 80 MG PO CHEW: 80.0000 mg | CHEWABLE_TABLET | ORAL | Status: DC | PRN

## 2015-09-01 MED ORDER — BENZOCAINE-MENTHOL 20-0.5 % EX AERO
1.0000 "application " | INHALATION_SPRAY | CUTANEOUS | Status: DC | PRN
  Administered 2015-09-01: 1 via TOPICAL
  Filled 2015-09-01: qty 56

## 2015-09-01 MED ORDER — SENNOSIDES-DOCUSATE SODIUM 8.6-50 MG PO TABS
2.0000 | ORAL_TABLET | ORAL | Status: DC
  Administered 2015-09-01: 2 via ORAL
  Filled 2015-09-01: qty 2

## 2015-09-01 MED ORDER — ONDANSETRON HCL 4 MG PO TABS: 4.0000 mg | ORAL_TABLET | ORAL | Status: DC | PRN

## 2015-09-01 MED ORDER — ACETAMINOPHEN 325 MG PO TABS: 650.0000 mg | ORAL_TABLET | ORAL | Status: DC | PRN

## 2015-09-01 MED ORDER — WITCH HAZEL-GLYCERIN EX PADS: 1.0000 "application " | MEDICATED_PAD | CUTANEOUS | Status: DC | PRN

## 2015-09-01 MED ORDER — LANOLIN HYDROUS EX OINT: TOPICAL_OINTMENT | CUTANEOUS | Status: DC | PRN

## 2015-09-01 MED ORDER — PRENATAL MULTIVITAMIN CH
1.0000 | ORAL_TABLET | Freq: Every day | ORAL | Status: DC
  Administered 2015-09-01: 1 via ORAL
  Filled 2015-09-01: qty 1

## 2015-09-01 MED ORDER — OXYCODONE-ACETAMINOPHEN 5-325 MG PO TABS: 1.0000 | ORAL_TABLET | ORAL | Status: DC | PRN

## 2015-09-01 MED ORDER — ONDANSETRON HCL 4 MG/2ML IJ SOLN: 4.0000 mg | INTRAMUSCULAR | Status: DC | PRN

## 2015-09-01 MED ORDER — ZOLPIDEM TARTRATE 5 MG PO TABS: 5.0000 mg | ORAL_TABLET | Freq: Every evening | ORAL | Status: DC | PRN

## 2015-09-01 MED ORDER — DIBUCAINE 1 % RE OINT: 1.0000 "application " | TOPICAL_OINTMENT | RECTAL | Status: DC | PRN

## 2015-09-01 NOTE — Lactation Note (Signed)
This note was copied from the chart of Catherine Sila Sarsfield. Lactation Consultation Note  P2.  Baby has been sleepy not waking for feeds. Discussed late preterm feeding behavior and provided family w/ information sheet to read. Hand expression taught to Mom. Mother expressed drops of colostrum. Assisted in latching baby in football hold.  Sucks and some swallows observed for 10 min and baby fell asleep. Demonstrated how to wake baby and baby relatched for 10 more min. Supplemented w/ approx 2.5 ml of Alimentum using both spoon and finger syringe. Baby tongue thrusts and spilled some formula back out of mouth and fell asleep.  Encouraged parents that if he wakes cueing to give him remainder of volume. Set up DEBP.  Suggest mother post pump 4-6x day on premie setting and give baby back volume pumped at next feeding. Discussed milk storage guidelines and cleaning. Reminded mother to keep feeding within 30 min. Suggest that if supplement keeps spilling from infants mouth, parents may need to use bottle and nipple.     Patient Name: Catherine Edwards [id number] Date: 09/01/2015 Reason for consult: Initial assessment   Maternal Data Has patient been taught Hand Expression?: Yes Does the patient have breastfeeding experience prior to this delivery?: Yes  Feeding Feeding Type: Breast Fed Length of feed: 20 min (with break half way )  LATCH Score/Interventions Latch: Grasps breast easily, tongue down, lips flanged, rhythmical sucking.  Audible Swallowing: A few with stimulation  Type of Nipple: Everted at rest and after stimulation  Comfort (Breast/Nipple): Filling, red/small blisters or bruises, mild/mod discomfort     Hold (Positioning): Assistance needed to correctly position infant at breast and maintain latch.  LATCH Score: 7  Lactation Tools Discussed/Used     Consult Status Consult Status: Follow-up Date: 09/02/15 Follow-up type: In-patient    Dahlia Byes  [facility] 09/01/2015, 3:17 PM

## 2015-09-01 NOTE — Progress Notes (Signed)
Patient admitted to room 165 via wheelchair from MAU

## 2015-09-01 NOTE — Progress Notes (Signed)
Catherine Edwards, CNM remains at bedside. Dr. Shawnie Pons notified by Catherine Edwards of patient desire for vaginal breech delivery.

## 2015-09-01 NOTE — Progress Notes (Signed)
UR chart review completed.  

## 2015-09-01 NOTE — Progress Notes (Signed)
Post Partum Day 1 Subjective: no complaints, up ad lib, voiding and tolerating PO  Objective: Blood pressure 140/66, pulse 103, temperature 98.7 F (37.1 C), temperature source Oral, resp. rate 18, last menstrual period 12/06/2014, SpO2 99 %, unknown if currently breastfeeding.  Physical Exam:  General: alert, cooperative, appears stated age and no distress Lochia: appropriate Uterine Fundus: firm Incision: healing well DVT Evaluation: No evidence of DVT seen on physical exam. Negative Homan's sign. No cords or calf tenderness.   Recent Labs  08/29/15 1915 08/31/15 2045  HGB 12.3 13.2  HCT 37.3 38.8    Assessment/Plan: Plan for discharge tomorrow   LOS: 1 day   Wyvonnia Dusky DARLENE 09/01/2015, 6:53 AM

## 2015-09-02 DIAGNOSIS — O321XX0 Maternal care for breech presentation, not applicable or unspecified: Secondary | ICD-10-CM

## 2015-09-02 LAB — RPR: RPR Ser Ql: NONREACTIVE

## 2015-09-02 MED ORDER — IBUPROFEN 600 MG PO TABS: 600.0000 mg | ORAL_TABLET | Freq: Four times a day (QID) | ORAL | Status: DC | PRN

## 2015-09-02 NOTE — Discharge Summary (Signed)
OB Discharge Summary     Patient Name: Catherine Edwards DOB: 1976-06-13 MRN: [id number]  Date of admission: 08/31/2015 Delivering MD: Reva Bores   Date of discharge: 09/02/2015  Admitting diagnosis: 37W LABOR Intrauterine pregnancy: [redacted]w[redacted]d     Secondary diagnosis: None     Discharge diagnosis: Term Pregnancy Delivered and breech vaginal delivery                                                                                                Post partum procedures:Retained placenta requiring manual extraction  Augmentation: none  Complications: Cord avulsion without bleeding (likely velamentous insertion) with retained placenta requiring manual extraction  Hospital course:  Onset of Labor With Vaginal Delivery     39 y.o. yo [name] at [redacted]w[redacted]d was admitted in Active Laboron 08/31/2015. Patient had an labor course as follows:  Membrane Rupture Time/Date: 9:25 PM ,08/31/2015   Intrapartum Procedures: Episiotomy: None [1]                                         Lacerations:  2nd degree [3];Perineal [11]  Mediations and procedures used include: AROM  Patient had a delivery of a Viable infant. 08/31/2015  Information for the patient's newborn:  Catherine, Edwards [[id number]]  Delivery Method: Vaginal, Spontaneous Delivery (Filed from Delivery Summary)    Pateint had an uncomplicated postpartum course.  She is ambulating, tolerating a regular diet, passing flatus, and urinating well. Patient is discharged home in stable condition on No discharge date for patient encounter.Marland Kitchen    Physical exam  Filed Vitals:   09/01/15 0556 09/01/15 1308 09/01/15 1901 09/02/15 0701  BP: 140/66 146/51 116/46 119/67  Pulse: 103 88 83 96  Temp: 98.7 F (37.1 C) 98.4 F (36.9 C) 98.4 F (36.9 C) 98.5 F (36.9 C)  TempSrc: Oral Oral Oral Oral  Resp: SpO2:  97%     General: alert, cooperative and no distress Lochia: appropriate Uterine Fundus: firm Incision: N/A DVT Evaluation: No  evidence of DVT seen on physical exam. Negative Homan's sign. Labs: Lab Results  Component Value Date   WBC 13.3* 08/31/2015   HGB 13.2 08/31/2015   HCT 38.8 08/31/2015   MCV 82.6 08/31/2015   PLT 239 08/31/2015   CMP Latest Ref Rng 08/29/2015  Glucose 65 - 99 mg/dL 89  BUN 6 - 20 mg/dL 11  Creatinine 1.30 - 8.65 mg/dL 7.84  Sodium 696 - 295 mmol/L 135  Potassium 3.5 - 5.1 mmol/L 4.1  Chloride 101 - 111 mmol/L 106  CO2 22 - 32 mmol/L 22  Calcium 8.9 - 10.3 mg/dL 9.1  Total Protein 6.5 - 8.1 g/dL 6.5  Total Bilirubin 0.3 - 1.2 mg/dL 0.3  Alkaline Phos 38 - 126 U/L 150(H)  AST 15 - 41 U/L 21  ALT 14 - 54 U/L 32    Discharge instruction: per After Visit Summary and "Baby and Me Booklet".  Medications:  Current facility-administered medications:  .  acetaminophen (  TYLENOL) tablet 650 mg, 650 mg, Oral, Q4H PRN, Reva Bores, MD .  acetaminophen (TYLENOL) tablet 650 mg, 650 mg, Oral, Q4H PRN, Reva Bores, MD .  benzocaine-Menthol (DERMOPLAST) 20-0.5 % topical spray 1 application, 1 application, Topical, PRN, Reva Bores, MD, 1 application at 09/01/15 0554 .  citric acid-sodium citrate (ORACIT) solution 30 mL, 30 mL, Oral, Q2H PRN, Reva Bores, MD .  witch hazel-glycerin (TUCKS) pad 1 application, 1 application, Topical, PRN **AND** dibucaine (NUPERCAINAL) 1 % rectal ointment 1 application, 1 application, Rectal, PRN, Reva Bores, MD .  diphenhydrAMINE (BENADRYL) capsule 25 mg, 25 mg, Oral, Q6H PRN, Reva Bores, MD .  ibuprofen (ADVIL,MOTRIN) tablet 600 mg, 600 mg, Oral, 4 times per day, Reva Bores, MD, 600 mg at 09/02/15 0537 .  lactated ringers infusion 500-1,000 mL, 500-1,000 mL, Intravenous, PRN, Reva Bores, MD .  lactated ringers infusion, , Intravenous, Continuous, Reva Bores, MD, Stopped at 08/31/15 2218 .  lanolin ointment, , Topical, PRN, Reva Bores, MD .  lidocaine (PF) (XYLOCAINE) 1 % injection 30 mL, 30 mL, Subcutaneous, PRN, Reva Bores, MD, 30  mL at 08/31/15 2200 .  ondansetron (ZOFRAN) injection 4 mg, 4 mg, Intravenous, Q6H PRN, Reva Bores, MD .  ondansetron (ZOFRAN) tablet 4 mg, 4 mg, Oral, Q4H PRN **OR** ondansetron (ZOFRAN) injection 4 mg, 4 mg, Intravenous, Q4H PRN, Reva Bores, MD .  oxyCODONE-acetaminophen (PERCOCET/ROXICET) 5-325 MG per tablet 1 tablet, 1 tablet, Oral, Q4H PRN, Reva Bores, MD .  oxyCODONE-acetaminophen (PERCOCET/ROXICET) 5-325 MG per tablet 1 tablet, 1 tablet, Oral, Q4H PRN, Reva Bores, MD .  oxyCODONE-acetaminophen (PERCOCET/ROXICET) 5-325 MG per tablet 2 tablet, 2 tablet, Oral, Q4H PRN, Reva Bores, MD .  oxytocin (PITOCIN) IV BOLUS FROM BAG, 500 mL, Intravenous, Continuous, Reva Bores, MD, Last Rate: 999 mL/hr at 08/31/15 2218, 500 mL at 08/31/15 2218 .  oxytocin (PITOCIN) IV infusion 40 units in LR 1000 mL, 62.5 mL/hr, Intravenous, Continuous, Reva Bores, MD .  prenatal multivitamin tablet 1 tablet, 1 tablet, Oral, Q1200, Reva Bores, MD, 1 tablet at 09/01/15 1206 .  senna-docusate (Senokot-S) tablet 2 tablet, 2 tablet, Oral, Q24H, Reva Bores, MD, 2 tablet at 09/01/15 2318 .  simethicone (MYLICON) chewable tablet 80 mg, 80 mg, Oral, PRN, Reva Bores, MD .  zolpidem (AMBIEN) tablet 5 mg, 5 mg, Oral, QHS PRN, Reva Bores, MD  Diet: routine diet  Activity: Advance as tolerated. Pelvic rest for 6 weeks.   Outpatient follow up:6 weeks  Postpartum contraception: None -declined Newborn Data: Live born female  Birth Weight: 6 lb 7.5 oz (2935 g) APGAR: 9, 9  Baby Feeding: Breast Disposition:home with mother   09/02/2015 Catherine Edwards   OB FELLOW DISCHARGE ATTESTATION  I have seen and examined this patient and agree with above documentation in the resident's note.     Silvano Bilis, MD 8:59 AM

## 2015-09-02 NOTE — Discharge Instructions (Signed)
Places to get CIRCS:    [facility] [phone] $480 by 4 wks  [facility] [phone] $244 by 4 wks  [facility] [phone] $175 by 2 wks  [facility] [phone] $250 by 7 days [facility] [phone] $150 by 4 wks  Patient instructions: Monitor for signs of infection -fever, chills -uterine tenderness -foul smelling vaginal discharge -purulent vaginal discharge Monitor for increased bleeding *Call Dr. If having any concerning symptoms

## 2015-09-08 ENCOUNTER — Ambulatory Visit (HOSPITAL_COMMUNITY): Payer: Medicaid Other

## 2015-12-15 ENCOUNTER — Encounter (HOSPITAL_COMMUNITY): Payer: Self-pay | Admitting: Emergency Medicine

## 2015-12-15 ENCOUNTER — Emergency Department (HOSPITAL_COMMUNITY)
Admission: EM | Admit: 2015-12-15 | Discharge: 2015-12-15 | Disposition: A | Discharge status: Home / Self Care | Payer: Medicaid Other | Attending: Emergency Medicine | Admitting: Emergency Medicine

## 2015-12-15 DIAGNOSIS — E669 Obesity, unspecified: Secondary | ICD-10-CM | POA: Insufficient documentation

## 2015-12-15 DIAGNOSIS — R000 Tachycardia, unspecified: Secondary | ICD-10-CM | POA: Diagnosis not present

## 2015-12-15 DIAGNOSIS — J45909 Unspecified asthma, uncomplicated: Secondary | ICD-10-CM | POA: Insufficient documentation

## 2015-12-15 DIAGNOSIS — Z3202 Encounter for pregnancy test, result negative: Secondary | ICD-10-CM | POA: Diagnosis not present

## 2015-12-15 DIAGNOSIS — Z79899 Other long term (current) drug therapy: Secondary | ICD-10-CM | POA: Insufficient documentation

## 2015-12-15 DIAGNOSIS — R197 Diarrhea, unspecified: Secondary | ICD-10-CM | POA: Insufficient documentation

## 2015-12-15 DIAGNOSIS — R112 Nausea with vomiting, unspecified: Secondary | ICD-10-CM | POA: Diagnosis not present

## 2015-12-15 DIAGNOSIS — R1110 Vomiting, unspecified: Secondary | ICD-10-CM

## 2015-12-15 LAB — URINE MICROSCOPIC-ADD ON

## 2015-12-15 LAB — COMPREHENSIVE METABOLIC PANEL
ALT: 77 U/L — ABNORMAL HIGH (ref 14–54)
AST: 40 U/L (ref 15–41)
Albumin: 3.8 g/dL (ref 3.5–5.0)
Alkaline Phosphatase: 103 U/L (ref 38–126)
Anion gap: 12 (ref 5–15)
BUN: 15 mg/dL (ref 6–20)
CO2: 23 mmol/L (ref 22–32)
Calcium: 9.3 mg/dL (ref 8.9–10.3)
Chloride: 106 mmol/L (ref 101–111)
Creatinine, Ser: 0.78 mg/dL (ref 0.44–1.00)
GFR calc Af Amer: >60 mL/min (ref >60)
GFR calc non Af Amer: >60 mL/min (ref >60)
Glucose, Bld: 98 mg/dL (ref 65–99)
Potassium: 3.6 mmol/L (ref 3.5–5.1)
Sodium: 141 mmol/L (ref 135–145)
Total Bilirubin: 0.4 mg/dL (ref 0.3–1.2)
Total Protein: 7.3 g/dL (ref 6.5–8.1)

## 2015-12-15 LAB — URINALYSIS, ROUTINE W REFLEX MICROSCOPIC
Glucose, UA: NEGATIVE mg/dL
Ketones, ur: NEGATIVE mg/dL
Leukocytes, UA: NEGATIVE
Nitrite: NEGATIVE
Protein, ur: NEGATIVE mg/dL
Specific Gravity, Urine: 1.029 (ref 1.005–1.030)
pH: 5 (ref 5.0–8.0)

## 2015-12-15 LAB — CBC
HCT: 44.4 % (ref 36.0–46.0)
Hemoglobin: 14.7 g/dL (ref 12.0–15.0)
MCH: 26.6 pg (ref 26.0–34.0)
MCHC: 33.1 g/dL (ref 30.0–36.0)
MCV: 80.3 fL (ref 78.0–100.0)
Platelets: 274 10*3/uL (ref 150–400)
RBC: 5.53 MIL/uL — ABNORMAL HIGH (ref 3.87–5.11)
RDW: 15 % (ref 11.5–15.5)
WBC: 12.9 10*3/uL — ABNORMAL HIGH (ref 4.0–10.5)

## 2015-12-15 LAB — LIPASE, BLOOD: Lipase: 25 U/L (ref 11–51)

## 2015-12-15 LAB — POC URINE PREG, ED: Preg Test, Ur: NEGATIVE

## 2015-12-15 MED ORDER — ONDANSETRON HCL 4 MG/2ML IJ SOLN
4.0000 mg | Freq: Once | INTRAMUSCULAR | Status: AC | PRN
  Administered 2015-12-15: 4 mg via INTRAVENOUS
  Filled 2015-12-15: qty 2

## 2015-12-15 MED ORDER — SODIUM CHLORIDE 0.9 % IV BOLUS (SEPSIS)
2000.0000 mL | Freq: Once | INTRAVENOUS | Status: AC
  Administered 2015-12-15: 2000 mL via INTRAVENOUS

## 2015-12-15 MED ORDER — DICYCLOMINE HCL 20 MG PO TABS: 20.0000 mg | ORAL_TABLET | Freq: Two times a day (BID) | ORAL | Status: DC | PRN

## 2015-12-15 MED ORDER — DICYCLOMINE HCL 10 MG PO CAPS
10.0000 mg | ORAL_CAPSULE | Freq: Once | ORAL | Status: AC
  Administered 2015-12-15: 10 mg via ORAL
  Filled 2015-12-15: qty 1

## 2015-12-15 MED ORDER — ONDANSETRON HCL 4 MG/2ML IJ SOLN
4.0000 mg | Freq: Once | INTRAMUSCULAR | Status: AC
  Administered 2015-12-15: 4 mg via INTRAVENOUS
  Filled 2015-12-15: qty 2

## 2015-12-15 MED ORDER — ONDANSETRON HCL 4 MG PO TABS: 4.0000 mg | ORAL_TABLET | Freq: Four times a day (QID) | ORAL | Status: DC | PRN

## 2015-12-15 NOTE — ED Provider Notes (Signed)
CSN: [id number]     Arrival date & time 12/15/15  0130 History  By signing my name below, I, Doreatha MartinEva Mathews, attest that this documentation has been prepared under the direction and in the presence of Loren Raceravid Zamauri Nez, MD. Electronically Signed: Doreatha MartinEva Mathews, ED Scribe. 12/15/2015. 3:28 AM.    Chief Complaint  Patient presents with  . Emesis  . Diarrhea   The history is provided by the patient. No language interpreter was used.    HPI Comments: Catherine SprayVanessa Tsukamoto is a 40 y.o. female who presents to the Emergency Department complaining of moderate, intermittent emesis x6 onset 4 hours ago with associated nausea, diarrhea x1, abdominal cramping, abdominal borborygmus. Pt states relief of nausea and emesis with Zofran given after arrival. Pt states sick contact with daughter who has similar symptoms and being evaluated in the pediatric emergency department. She denies hematemesis, melena, hematochezia, fever, chills.   Past Medical History  Diagnosis Date  . Asthma   . History of multiple miscarriages   . Obesity    Past Surgical History  Procedure Laterality Date  . Cholecystectomy      laproscopic  . Wisdom tooth extraction     Family History  Problem Relation Age of Onset  . Anesthesia problems Neg Hx   . Cancer Mother   . Cancer Maternal Grandmother    Social History  Substance Use Topics  . Smoking status: Never Smoker   . Smokeless tobacco: Never Used  . Alcohol Use: No     Comment: not since pregnancy   OB History    Gravida Para Term Preterm AB TAB SAB Ectopic Multiple Living   9 2 2  7  7   0 2     Review of Systems  Constitutional: Negative for fever and chills.  Respiratory: Negative for shortness of breath.   Cardiovascular: Negative for chest pain.  Gastrointestinal: Positive for nausea, vomiting, abdominal pain and diarrhea. Negative for blood in stool and abdominal distention.  Musculoskeletal: Negative for back pain, neck pain and neck stiffness.  Skin: Negative  for rash and wound.  Neurological: Negative for dizziness, syncope, weakness, light-headedness, numbness and headaches.  All other systems reviewed and are negative.  Allergies  Review of patient's allergies indicates no known allergies.  Home Medications   Prior to Admission medications   Medication Sig Start Date End Date Taking? Authorizing Provider  albuterol (PROVENTIL HFA;VENTOLIN HFA) 108 (90 BASE) MCG/ACT inhaler Inhale 2 puffs into the lungs every 4 (four) hours as needed for wheezing or shortness of breath. Wheezing or shortness of breath   Yes Historical Provider, MD  dicyclomine (BENTYL) 20 MG tablet Take 1 tablet (20 mg total) by mouth 2 (two) times daily as needed for spasms. 12/15/15   Loren Raceravid Clarion Mooneyhan, MD  ibuprofen (ADVIL,MOTRIN) 600 MG tablet Take 1 tablet (600 mg total) by mouth every 6 (six) hours as needed for moderate pain or cramping. Patient not taking: Reported on 12/15/2015 09/02/15   Reva Boresanya S Pratt, MD  ondansetron (ZOFRAN) 4 MG tablet Take 1 tablet (4 mg total) by mouth every 6 (six) hours as needed for nausea or vomiting. 12/15/15   Loren Raceravid Enos Muhl, MD   BP 113/71 mmHg  Pulse 109  Temp(Src) 99.2 F (37.3 C) (Oral)  Resp 16  Ht 5\' 5"  (1.651 m)  Wt 267 lb (121.11 kg)  BMI 44.43 kg/m2  SpO2 98% Physical Exam  Constitutional: She is oriented to person, place, and time. She appears well-developed and well-nourished. No distress.  HENT:  Head: Normocephalic and atraumatic.  Mouth/Throat: Oropharynx is clear and moist.  Eyes: EOM are normal. Pupils are equal, round, and reactive to light.  Neck: Normal range of motion. Neck supple.  Cardiovascular: Regular rhythm.   Tachycardia  Pulmonary/Chest: Effort normal and breath sounds normal. No respiratory distress. She has no wheezes. She has no rales.  Abdominal: Soft. She exhibits no distension and no mass. There is no tenderness. There is no rebound and no guarding.  Hyperactive bowel sounds  Musculoskeletal: Normal  range of motion. She exhibits no edema or tenderness.  No CVA tenderness bilaterally.  Neurological: She is alert and oriented to person, place, and time.  Moves all extremities without deficit. Sensation is fully intact.  Skin: Skin is warm and dry. No rash noted. No erythema.  Psychiatric: She has a normal mood and affect. Her behavior is normal.  Nursing note and vitals reviewed.   ED Course  Procedures (including critical care time) DIAGNOSTIC STUDIES: Oxygen Saturation is 97% on RA, normal by my interpretation.    COORDINATION OF CARE: 3:28 AM Discussed treatment plan with pt at bedside and pt agreed to plan.   Labs Review Labs Reviewed  COMPREHENSIVE METABOLIC PANEL - Abnormal; Notable for the following:    ALT 77 (*)    All other components within normal limits  CBC - Abnormal; Notable for the following:    WBC 12.9 (*)    RBC 5.53 (*)    All other components within normal limits  URINALYSIS, ROUTINE W REFLEX MICROSCOPIC (NOT AT [facility]) - Abnormal; Notable for the following:    Hgb urine dipstick SMALL (*)    Bilirubin Urine SMALL (*)    All other components within normal limits  URINE MICROSCOPIC-ADD ON - Abnormal; Notable for the following:    Squamous Epithelial / LPF 0-5 (*)    Bacteria, UA RARE (*)    All other components within normal limits  LIPASE, BLOOD  POC URINE PREG, ED   I have personally reviewed and evaluated these lab results as part of my medical decision-making.   MDM   Final diagnoses:  Vomiting and diarrhea   I personally performed the services described in this documentation, which was scribed in my presence. The recorded information has been reviewed and is accurate.   Patient is well-appearing. She has benign abdominal exam. She was given IV fluids and antiemetic in the emergency department. Her symptoms have improved. She has no further vomiting. She is tolerating oral fluids. We'll discharge home with symptomatic control. Return precautions  given  Loren Racer, MD 12/15/15 [phone]

## 2015-12-15 NOTE — ED Notes (Signed)
Pt. reports multiple emesis with diarrhea onset this evening suspects food poisoning after eating at a local restaurant. Denies fever or chills. No abdominal pain.

## 2015-12-15 NOTE — Discharge Instructions (Signed)
Nausea and Vomiting Nausea means you feel sick to your stomach. Throwing up (vomiting) is a reflex where stomach contents come out of your mouth. HOME CARE   Take medicine as told by your doctor.  Do not force yourself to eat. However, you do need to drink fluids.  If you feel like eating, eat a normal diet as told by your doctor.  Eat rice, wheat, potatoes, bread, lean meats, yogurt, fruits, and vegetables.  Avoid high-fat foods.  Drink enough fluids to keep your pee (urine) clear or pale yellow.  Ask your doctor how to replace body fluid losses (rehydrate). Signs of body fluid loss (dehydration) include:  Feeling very thirsty.  Dry lips and mouth.  Feeling dizzy.  Dark pee.  Peeing less than normal.  Feeling confused.  Fast breathing or heart rate. GET HELP RIGHT AWAY IF:   You have blood in your throw up.  You have black or bloody poop (stool).  You have a bad headache or stiff neck.  You feel confused.  You have bad belly (abdominal) pain.  You have chest pain or trouble breathing.  You do not pee at least once every 8 hours.  You have cold, clammy skin.  You keep throwing up after 24 to 48 hours.  You have a fever. MAKE SURE YOU:   Understand these instructions.  Will watch your condition.  Will get help right away if you are not doing well or get worse.   This information is not intended to replace advice given to you by your health care provider. Make sure you discuss any questions you have with your health care provider.   Document Released: 05/17/2008 Document Revised: 02/21/2012 Document Reviewed: 04/30/2011 Elsevier Interactive Patient Education 2016 [employer]  Diarrhea Diarrhea is frequent loose and watery bowel movements. It can cause you to feel weak and dehydrated. Dehydration can cause you to become tired and thirsty, have a dry mouth, and have decreased urination that often is dark yellow. Diarrhea is a sign of another problem,  most often an infection that will not last long. In most cases, diarrhea typically lasts 2-3 days. However, it can last longer if it is a sign of something more serious. It is important to treat your diarrhea as directed by your caregiver to lessen or prevent future episodes of diarrhea. CAUSES  Some common causes include:  Gastrointestinal infections caused by viruses, bacteria, or parasites.  Food poisoning or food allergies.  Certain medicines, such as antibiotics, chemotherapy, and laxatives.  Artificial sweeteners and fructose.  Digestive disorders. HOME CARE INSTRUCTIONS  Ensure adequate fluid intake (hydration): Have 1 cup (8 oz) of fluid for each diarrhea episode. Avoid fluids that contain simple sugars or sports drinks, fruit juices, whole milk products, and sodas. Your urine should be clear or pale yellow if you are drinking enough fluids. Hydrate with an oral rehydration solution that you can purchase at pharmacies, retail stores, and online. You can prepare an oral rehydration solution at home by mixing the following ingredients together:   - tsp table salt.   tsp baking soda.   tsp salt substitute containing potassium chloride.  1  tablespoons sugar.  1 L (34 oz) of water.  Certain foods and beverages may increase the speed at which food moves through the gastrointestinal (GI) tract. These foods and beverages should be avoided and include:  Caffeinated and alcoholic beverages.  High-fiber foods, such as raw fruits and vegetables, nuts, seeds, and whole grain breads and cereals.  Foods and beverages sweetened with sugar alcohols, such as xylitol, sorbitol, and mannitol.  Some foods may be well tolerated and may help thicken stool including:  Starchy foods, such as rice, toast, pasta, low-sugar cereal, oatmeal, grits, baked potatoes, crackers, and bagels.  Bananas.  Applesauce.  Add probiotic-rich foods to help increase healthy bacteria in the GI tract, such as  yogurt and fermented milk products.  Wash your hands well after each diarrhea episode.  Only take over-the-counter or prescription medicines as directed by your caregiver.  Take a warm bath to relieve any burning or pain from frequent diarrhea episodes. SEEK IMMEDIATE MEDICAL CARE IF:   You are unable to keep fluids down.  You have persistent vomiting.  You have blood in your stool, or your stools are black and tarry.  You do not urinate in 6-8 hours, or there is only a small amount of very dark urine.  You have abdominal pain that increases or localizes.  You have weakness, dizziness, confusion, or light-headedness.  You have a severe headache.  Your diarrhea gets worse or does not get better.  You have a fever or persistent symptoms for more than 2-3 days.  You have a fever and your symptoms suddenly get worse. MAKE SURE YOU:   Understand these instructions.  Will watch your condition.  Will get help right away if you are not doing well or get worse.   This information is not intended to replace advice given to you by your health care provider. Make sure you discuss any questions you have with your health care provider.   Document Released: 11/19/2002 Document Revised: 12/20/2014 Document Reviewed: 08/06/2012 Elsevier Interactive Patient Education [employer]

## 2015-12-15 NOTE — ED Notes (Signed)
Patient verbalized understanding of discharge instructions and denies any further needs or questions at this time. VS stable. Patient ambulatory with steady gait.  

## 2018-12-08 ENCOUNTER — Encounter (HOSPITAL_COMMUNITY): Payer: Self-pay

## 2018-12-08 ENCOUNTER — Emergency Department (HOSPITAL_COMMUNITY)
Admission: EM | Admit: 2018-12-08 | Discharge: 2018-12-08 | Disposition: A | Discharge status: Home / Self Care | Payer: Medicaid Other | Attending: Emergency Medicine | Admitting: Emergency Medicine

## 2018-12-08 ENCOUNTER — Other Ambulatory Visit: Payer: Self-pay

## 2018-12-08 ENCOUNTER — Emergency Department (HOSPITAL_COMMUNITY): Payer: Medicaid Other

## 2018-12-08 DIAGNOSIS — J4521 Mild intermittent asthma with (acute) exacerbation: Secondary | ICD-10-CM | POA: Insufficient documentation

## 2018-12-08 DIAGNOSIS — R0602 Shortness of breath: Secondary | ICD-10-CM | POA: Diagnosis present

## 2018-12-08 LAB — CBG MONITORING, ED: Glucose-Capillary: 100 mg/dL — ABNORMAL HIGH (ref 70–99)

## 2018-12-08 MED ORDER — PREDNISONE 20 MG PO TABS: 60.0000 mg | ORAL_TABLET | Freq: Every day | ORAL | 0 refills | Status: DC

## 2018-12-08 MED ORDER — ALBUTEROL SULFATE HFA 108 (90 BASE) MCG/ACT IN AERS
1.0000 | INHALATION_SPRAY | Freq: Once | RESPIRATORY_TRACT | Status: AC
  Administered 2018-12-08: 1 via RESPIRATORY_TRACT
  Filled 2018-12-08: qty 6.7

## 2018-12-08 MED ORDER — ALBUTEROL SULFATE (2.5 MG/3ML) 0.083% IN NEBU
5.0000 mg | INHALATION_SOLUTION | Freq: Once | RESPIRATORY_TRACT | Status: AC
  Administered 2018-12-08: 5 mg via RESPIRATORY_TRACT
  Filled 2018-12-08: qty 6

## 2018-12-08 NOTE — Discharge Instructions (Addendum)
Take prednisone until completed.  Use albuterol inhaler every 4-6 hours as needed for shortness of breath, chest tightness, or wheezing.  Please follow-up with your doctor for further evaluation and treatment of your ongoing symptoms.  I would discuss a sleep study with them, as you may have sleep apnea.  Please return the emergency department if you develop any new or worsening symptoms.

## 2018-12-08 NOTE — ED Provider Notes (Signed)
[facility] EMERGENCY DEPARTMENT Provider Note   CSN: [id number] Arrival date & time: 12/08/18  1914     History   Chief Complaint Chief Complaint  Patient presents with  . Asthma    HPI Catherine Edwards is a 42 y.o. female with history of obesity, asthma who presents with a 3-week history of intermittent cough and shortness of breath.  Patient denies any fevers.  She reports associated chest tightness intermittently and pain in her shoulders, which she states she gets asthma exacerbations.  She has been out of her inhaler.  She has not taken any other medications over the counter, but has used steam at home.  She denies any abdominal pain, nausea, vomiting.  Patient also reports that lately, she has been waking up in the middle the night feel like she cannot breathe.  She reports she is heavier than she has ever been and feels like this is the cause.  HPI  Past Medical History:  Diagnosis Date  . Asthma   . History of multiple miscarriages   . Obesity     Patient Active Problem List   Diagnosis Date Noted  . Breech delivery 09/02/2015  . Retained placenta, delivered, current hospitalization 09/02/2015  . Active labor 08/31/2015  . Uterine size date discrepancy pregnancy   . [redacted] weeks gestation of pregnancy   . Maternal age 26+, multigravida, antepartum   . Screening, antenatal, for fetal anatomic survey   . [redacted] weeks gestation of pregnancy   . Abnormal maternal serum screening test 03/30/2012    Past Surgical History:  Procedure Laterality Date  . CHOLECYSTECTOMY     laproscopic  . WISDOM TOOTH EXTRACTION       OB History    Gravida  9   Para  2   Term  2   Preterm      AB  7   Living  2     SAB  7   TAB      Ectopic      Multiple  0   Live Births  2            Home Medications    Prior to Admission medications   Medication Sig Start Date End Date Taking? Authorizing Provider  albuterol (PROVENTIL HFA;VENTOLIN HFA) 108  (90 BASE) MCG/ACT inhaler Inhale 2 puffs into the lungs every 4 (four) hours as needed for wheezing or shortness of breath. Wheezing or shortness of breath    [provider]  dicyclomine (BENTYL) 20 MG tablet Take 1 tablet (20 mg total) by mouth 2 (two) times daily as needed for spasms. 12/15/15   Loren Racer, MD  ibuprofen (ADVIL,MOTRIN) 600 MG tablet Take 1 tablet (600 mg total) by mouth every 6 (six) hours as needed for moderate pain or cramping. Patient not taking: Reported on 12/15/2015 09/02/15   Reva Bores, MD  ondansetron (ZOFRAN) 4 MG tablet Take 1 tablet (4 mg total) by mouth every 6 (six) hours as needed for nausea or vomiting. 12/15/15   Loren Racer, MD  predniSONE (DELTASONE) 20 MG tablet Take 3 tablets (60 mg total) by mouth daily. 12/08/18   Emi Holes, PA-C    Family History Family History  Problem Relation Age of Onset  . Cancer Mother   . Cancer Maternal Grandmother   . Anesthesia problems Neg Hx     Social History Social History   Tobacco Use  . Smoking status: Never Smoker  . Smokeless tobacco: Never  Used  Substance Use Topics  . Alcohol use: No    Alcohol/week: 1.0 standard drinks    Types: 1 Shots of liquor per week    Comment: not since pregnancy  . Drug use: No     Allergies   Patient has no known allergies.   Review of Systems Review of Systems  Constitutional: Negative for chills and fever.  HENT: Negative for facial swelling and sore throat.   Respiratory: Positive for cough, chest tightness, shortness of breath and wheezing.   Cardiovascular: Negative for chest pain.  Gastrointestinal: Negative for abdominal pain, nausea and vomiting.  Genitourinary: Negative for dysuria.  Musculoskeletal: Negative for back pain.  Skin: Negative for rash and wound.  Neurological: Negative for headaches.  Psychiatric/Behavioral: The patient is not nervous/anxious.      Physical Exam Updated Vital Signs BP (!) 140/99 (BP Location:  Right Arm)   Pulse 78   Temp 97.9 F (36.6 C) (Oral)   Resp 18   LMP 12/07/2018 (Exact Date)   SpO2 98%   Physical Exam Vitals signs and nursing note reviewed.  Constitutional:      General: She is not in acute distress.    Appearance: She is well-developed. She is obese. She is not diaphoretic.  HENT:     Head: Normocephalic and atraumatic.     Mouth/Throat:     Pharynx: No oropharyngeal exudate.  Eyes:     General: No scleral icterus.       Right eye: No discharge.        Left eye: No discharge.     Conjunctiva/sclera: Conjunctivae normal.     Pupils: Pupils are equal, round, and reactive to light.  Neck:     Musculoskeletal: Normal range of motion and neck supple.     Thyroid: No thyromegaly.  Cardiovascular:     Rate and Rhythm: Normal rate and regular rhythm.     Heart sounds: Normal heart sounds. No murmur. No friction rub. No gallop.   Pulmonary:     Effort: Pulmonary effort is normal. No respiratory distress.     Breath sounds: Normal breath sounds. No stridor. No wheezing or rales.  Abdominal:     General: Bowel sounds are normal. There is no distension.     Palpations: Abdomen is soft.     Tenderness: There is no abdominal tenderness. There is no guarding or rebound.  Lymphadenopathy:     Cervical: No cervical adenopathy.  Skin:    General: Skin is warm and dry.     Coloration: Skin is not pale.     Findings: No rash.  Neurological:     Mental Status: She is alert.     Coordination: Coordination normal.      ED Treatments / Results  Labs (all labs ordered are listed, but only abnormal results are displayed) Labs Reviewed  CBG MONITORING, ED - Abnormal; Notable for the following components:      Result Value   Glucose-Capillary 100 (*)    All other components within normal limits    EKG None  Radiology Dg Chest 2 View  Result Date: 12/08/2018 CLINICAL DATA:  Per patient reports mid chest pain X 8 months, reports recent onset of coughing up  phlegm, severe SOB earlier this morning. EXAM: CHEST - 2 VIEW COMPARISON:  08/19/2012 FINDINGS: The cardiac silhouette is normal in size and configuration. Normal mediastinal and hilar contours. Lungs are clear.  No pleural effusion or pneumothorax. Skeletal structures are unremarkable. IMPRESSION: No active  cardiopulmonary disease. Electronically Signed   By: Amie Portlandavid  Ormond M.D.   On: 12/08/2018 08:36    Procedures Procedures (including critical care time)  Medications Ordered in ED Medications  albuterol (PROVENTIL HFA;VENTOLIN HFA) 108 (90 Base) MCG/ACT inhaler 1 puff (has no administration in time range)  albuterol (PROVENTIL) (2.5 MG/3ML) 0.083% nebulizer solution 5 mg (5 mg Nebulization Given 12/08/18 0809)     Initial Impression / Assessment and Plan / ED Course  I have reviewed the triage vital signs and the nursing notes.  Pertinent labs & imaging results that were available during my care of the patient were reviewed by me and considered in my medical decision making (see chart for details).     Patient with probable asthma exacerbation.  Will treat with prednisone burst.  Will refill albuterol inhaler.  Chest x-ray is clear.  I discussed the possibility of sleep apnea with the patient that she should discuss with her doctor about a sleep study.  Patient was concerned given her weight gain, that she may now have diabetes, CBG is 100 today.  Also checked prior to prednisone considering this concern. Patient feeling improved after albuterol nebulizer in the ED.  Her oxygen saturations are 98%.  Lungs are clear.  Patient to follow-up with her PCP for further management of her asthma.   Return precautions discussed.  Patient understands and agrees with plan.  Patient vitals stable throughout ED course and discharged in satisfactory condition.  Final Clinical Impressions(s) / ED Diagnoses   Final diagnoses:  Mild intermittent asthma with exacerbation    ED Discharge Orders          Ordered    predniSONE (DELTASONE) 20 MG tablet  Daily     12/08/18 0907           Emi HolesLaw, Alexiana Laverdure M, PA-C 12/08/18 [id number]    Marily MemosMesner, Jason, MD 12/08/18 1527

## 2018-12-08 NOTE — ED Triage Notes (Signed)
Pt states she has had asthma exacerbation at home. States she is out of albuterol for her neb machine. Pt states recent weight gain that has been worsening her asthma

## 2020-09-01 ENCOUNTER — Encounter (HOSPITAL_COMMUNITY): Payer: Self-pay

## 2020-09-01 ENCOUNTER — Emergency Department (HOSPITAL_COMMUNITY): Payer: Medicaid Other

## 2020-09-01 ENCOUNTER — Emergency Department (HOSPITAL_COMMUNITY)
Admission: EM | Admit: 2020-09-01 | Discharge: 2020-09-02 | Disposition: A | Discharge status: Home / Self Care | Payer: Medicaid Other | Attending: Emergency Medicine | Admitting: Emergency Medicine

## 2020-09-01 ENCOUNTER — Other Ambulatory Visit: Payer: Self-pay

## 2020-09-01 DIAGNOSIS — Z20822 Contact with and (suspected) exposure to covid-19: Secondary | ICD-10-CM | POA: Diagnosis not present

## 2020-09-01 DIAGNOSIS — R0789 Other chest pain: Secondary | ICD-10-CM | POA: Diagnosis present

## 2020-09-01 DIAGNOSIS — J4521 Mild intermittent asthma with (acute) exacerbation: Secondary | ICD-10-CM | POA: Diagnosis not present

## 2020-09-01 LAB — CBC
HCT: 42.1 % (ref 36.0–46.0)
Hemoglobin: 13 g/dL (ref 12.0–15.0)
MCH: 26.6 pg (ref 26.0–34.0)
MCHC: 30.9 g/dL (ref 30.0–36.0)
MCV: 86.1 fL (ref 80.0–100.0)
Platelets: 285 10*3/uL (ref 150–400)
RBC: 4.89 MIL/uL (ref 3.87–5.11)
RDW: 14 % (ref 11.5–15.5)
WBC: 8.1 10*3/uL (ref 4.0–10.5)
nRBC: 0 % (ref 0.0–0.2)

## 2020-09-01 LAB — BASIC METABOLIC PANEL
Anion gap: 8 (ref 5–15)
BUN: 14 mg/dL (ref 6–20)
CO2: 22 mmol/L (ref 22–32)
Calcium: 8.9 mg/dL (ref 8.9–10.3)
Chloride: 110 mmol/L (ref 98–111)
Creatinine, Ser: 0.69 mg/dL (ref 0.44–1.00)
GFR calc Af Amer: >60 mL/min (ref >60)
GFR calc non Af Amer: >60 mL/min (ref >60)
Glucose, Bld: 100 mg/dL — ABNORMAL HIGH (ref 70–99)
Potassium: 3.7 mmol/L (ref 3.5–5.1)
Sodium: 140 mmol/L (ref 135–145)

## 2020-09-01 LAB — I-STAT BETA HCG BLOOD, ED (MC, WL, AP ONLY): I-stat hCG, quantitative: <5 m[IU]/mL (ref <5)

## 2020-09-01 LAB — TROPONIN I (HIGH SENSITIVITY): Troponin I (High Sensitivity): <2 ng/L (ref <18)

## 2020-09-01 NOTE — ED Triage Notes (Signed)
Cp and SOB x1 week, hx asthma but no significant cardiac history. Started as dyspnea with exertion now dyspnea at rest. Cough, no fevers, not COVID vaccinated. Spo2 98% on RA. No diagnosis of sleep apnea but stops breathing while sleeping x1 month.

## 2020-09-02 LAB — TROPONIN I (HIGH SENSITIVITY): Troponin I (High Sensitivity): 4 ng/L (ref <18)

## 2020-09-02 LAB — SARS CORONAVIRUS 2 BY RT PCR (HOSPITAL ORDER, PERFORMED IN ~~LOC~~ HOSPITAL LAB): SARS Coronavirus 2: NEGATIVE

## 2020-09-02 MED ORDER — PREDNISONE 20 MG PO TABS: 60.0000 mg | ORAL_TABLET | Freq: Every day | ORAL | 0 refills | Status: DC

## 2020-09-02 MED ORDER — ACETAMINOPHEN 325 MG PO TABS
650.0000 mg | ORAL_TABLET | Freq: Once | ORAL | Status: AC
  Administered 2020-09-02: 650 mg via ORAL
  Filled 2020-09-02: qty 2

## 2020-09-02 MED ORDER — ALBUTEROL SULFATE HFA 108 (90 BASE) MCG/ACT IN AERS
4.0000 | INHALATION_SPRAY | Freq: Once | RESPIRATORY_TRACT | Status: AC
  Administered 2020-09-02: 4 via RESPIRATORY_TRACT
  Filled 2020-09-02: qty 6.7

## 2020-09-02 MED ORDER — ALBUTEROL SULFATE HFA 108 (90 BASE) MCG/ACT IN AERS: 2.0000 | INHALATION_SPRAY | RESPIRATORY_TRACT | 1 refills | Status: AC | PRN

## 2020-09-02 NOTE — ED Provider Notes (Signed)
[facility]   CSN: [id number] Arrival date & time: 09/01/20  1750     History Chief Complaint  Patient presents with  . Chest Pain  . Shortness of Breath    Catherine Edwards is a 44 y.o. female.  HPI 44 year old female with a history of asthma, obesity resents to the ER with complaints of ongoing shortness of breath and chest tightness for approximately a month.  Patient states that she has a history of asthma, and has been in between PCPs.  She ran out of her home albuterol approximately 2 months ago, and has been having progressing shortness of breath on exertion, and at times at rest.  She states that at night, she wakes up gasping for air and feels as though she cannot breathe.  She does not have a history of sleep apnea and has never been evaluated for this.  She denies any fevers or chills, endorses a mild headache with no vision changes or vomiting. No abdominal pain. No noticeable LE swelling. No cardiac history.   She is not vaccinated for Covid.  No known recent exposures.  Denies any cough, hemoptysis.  No recent travel or surgeries, not on supplemental estrogen.  Denies a history of smoking.     Past Medical History:  Diagnosis Date  . Asthma   . History of multiple miscarriages   . Obesity     Patient Active Problem List   Diagnosis Date Noted  . Breech delivery 09/02/2015  . Retained placenta, delivered, current hospitalization 09/02/2015  . Active labor 08/31/2015  . Uterine size date discrepancy pregnancy   . [redacted] weeks gestation of pregnancy   . Maternal age 835+, multigravida, antepartum   . Screening, antenatal, for fetal anatomic survey   . [redacted] weeks gestation of pregnancy   . Abnormal maternal serum screening test 03/30/2012    Past Surgical History:  Procedure Laterality Date  . CHOLECYSTECTOMY     laproscopic  . WISDOM TOOTH EXTRACTION       OB History    Gravida  9   Para  2   Term  2    Preterm      AB  7   Living  2     SAB  7   TAB      Ectopic      Multiple  0   Live Births  2           Family History  Problem Relation Age of Onset  . Cancer Mother   . Cancer Maternal Grandmother   . Anesthesia problems Neg Hx     Social History   Tobacco Use  . Smoking status: Never Smoker  . Smokeless tobacco: Never Used  Substance Use Topics  . Alcohol use: No    Alcohol/week: 1.0 standard drink    Types: 1 Shots of liquor per week    Comment: not since pregnancy  . Drug use: No    Home Medications Prior to Admission medications   Medication Sig Start Date End Date Taking? Authorizing Provider  albuterol (VENTOLIN HFA) 108 (90 Base) MCG/ACT inhaler Inhale 2 puffs into the lungs every 4 (four) hours as needed for wheezing or shortness of breath. Wheezing or shortness of breath 09/02/20   Mare FerrariBelaya, Delva Derden A, PA-C  dicyclomine (BENTYL) 20 MG tablet Take 1 tablet (20 mg total) by mouth 2 (two) times daily as needed for spasms. 12/15/15   Loren RacerYelverton, David, MD  ibuprofen (ADVIL,MOTRIN)  600 MG tablet Take 1 tablet (600 mg total) by mouth every 6 (six) hours as needed for moderate pain or cramping. Patient not taking: Reported on 12/15/2015 09/02/15   Reva Bores, MD  ondansetron (ZOFRAN) 4 MG tablet Take 1 tablet (4 mg total) by mouth every 6 (six) hours as needed for nausea or vomiting. 12/15/15   Loren Racer, MD  predniSONE (DELTASONE) 20 MG tablet Take 3 tablets (60 mg total) by mouth daily. 09/02/20   Mare Ferrari, PA-C    Allergies    Patient has no known allergies.  Review of Systems   Review of Systems  Constitutional: Negative for chills and fever.  HENT: Negative for ear pain and sore throat.   Eyes: Negative for pain and visual disturbance.  Respiratory: Positive for shortness of breath. Negative for cough.   Cardiovascular: Positive for chest pain. Negative for palpitations.  Gastrointestinal: Negative for abdominal pain and vomiting.    Genitourinary: Negative for dysuria and hematuria.  Musculoskeletal: Negative for arthralgias and back pain.  Skin: Negative for color change and rash.  Neurological: Positive for headaches. Negative for seizures and syncope.  All other systems reviewed and are negative.   Physical Exam Updated Vital Signs BP 130/63 (BP Location: Left Arm)   Pulse 77   Temp 98.6 F (37 C)   Resp 12   SpO2 98%   Physical Exam Vitals and nursing note reviewed.  Constitutional:      General: She is not in acute distress.    Appearance: She is well-developed. She is obese. She is not ill-appearing, toxic-appearing or diaphoretic.  HENT:     Head: Normocephalic and atraumatic.  Eyes:     Conjunctiva/sclera: Conjunctivae normal.  Cardiovascular:     Rate and Rhythm: Normal rate and regular rhythm.     Pulses:          Radial pulses are 2+ on the right side and 2+ on the left side.     Heart sounds: No murmur heard.   Pulmonary:     Effort: Pulmonary effort is normal. No tachypnea, accessory muscle usage or respiratory distress.     Breath sounds: Normal breath sounds. No stridor. No wheezing.  Abdominal:     Palpations: Abdomen is soft.     Tenderness: There is no abdominal tenderness.  Musculoskeletal:     Cervical back: Neck supple.     Right lower leg: No tenderness. No edema.     Left lower leg: No tenderness. No edema.  Skin:    General: Skin is warm and dry.     Capillary Refill: Capillary refill takes less than 2 seconds.     Findings: No erythema.  Neurological:     General: No focal deficit present.     Mental Status: She is alert.  Psychiatric:        Mood and Affect: Mood normal.        Behavior: Behavior normal.     ED Results / Procedures / Treatments   Labs (all labs ordered are listed, but only abnormal results are displayed) Labs Reviewed  BASIC METABOLIC PANEL - Abnormal; Notable for the following components:      Result Value   Glucose, Bld 100 (*)    All  other components within normal limits  SARS CORONAVIRUS 2 BY RT PCR (HOSPITAL ORDER, PERFORMED IN [facility])  CBC  I-STAT BETA HCG BLOOD, ED ([facility], [facility], [facility] ONLY)  TROPONIN I (HIGH SENSITIVITY)  TROPONIN  I (HIGH SENSITIVITY)    EKG EKG Interpretation  Date/Time:  Tuesday September 02 2020 08:15:11 EDT Ventricular Rate:  77 PR Interval:  146 QRS Duration: 92 QT Interval:  372 QTC Calculation: 421 R Axis:   36 Text Interpretation: Sinus rhythm Borderline T abnormalities, anterior leads No significant change since last tracing Confirmed by Alvira Monday ([id number]) on 09/02/2020 8:41:14 AM   Radiology DG Chest 2 View  Result Date: 09/01/2020 CLINICAL DATA:  Chest pain and SOB. EXAM: CHEST - 2 VIEW COMPARISON:  12/08/2018 FINDINGS: Normal heart size. No pleural effusion, airspace consolidation or atelectasis. No interstitial edema. The visualized osseous structures are unremarkable. IMPRESSION: No active cardiopulmonary abnormalities. Electronically Signed   By: Signa Kell M.D.   On: 09/01/2020 19:18    Procedures Procedures (including critical care time)  Medications Ordered in ED Medications  albuterol (VENTOLIN HFA) 108 (90 Base) MCG/ACT inhaler 4 puff (4 puffs Inhalation Given 09/02/20 0837)  acetaminophen (TYLENOL) tablet 650 mg (650 mg Oral Given 09/02/20 3614)    ED Course  I have reviewed the triage vital signs and the nursing notes.  Pertinent labs & imaging results that were available during my care of the patient were reviewed by me and considered in my medical decision making (see chart for details).    MDM Rules/Calculators/A&P                         44 year old female with complaints of ongoing shortness of breath and chest tightness for the last month On presentation, she is alert, oriented, nontoxic-appearing, no acute distress, speaking full sentences without increased work of breathing.  Physical exam with slightly decreased breath sounds,  however no wheezes noted.  Abdomen is soft and nontender.  No lower extremity swelling, erythema.  She is obese.  CBC and BMP without any significant abnormalities.  Delta troponin negative.  2 EKGs performed at triage and currently here in the ED without any acute changes.  Chest x-ray without evidence of pneumonia, pneumothorax, or any other cardiopulmonary abnormality.  DDx includes Covid, pneumonia, asthma exacerbation, DVT/PE, onset CHF.  Patient is PERC negative, no evidence of fluid overload on chest x-ray or in her lower extremities.  Will swab for Covid here.  Suspect this is secondary to her asthma exacerbation, patient states that she has not had her medications in over a month.  We will treat with albuterol here in the ED, Tylenol for headache.  Headache without any neurological findings, normal blood pressure, no recent falls.  Normal gross neuro exam.  Does not think she needs any CT at this time.  Will reassess after albuterol.  9:50 AM: On reassessment, patient had significant improvement in her shortness of breath after 4 puffs of albuterol.  I will send her home with a refill of albuterol and a short course of prednisone.  I stressed that the patient needs to make sure that she follows up with the PCP which she was understanding and is agreeable.  I explained her that she likely would benefit from further outpatient evaluation of sleep apnea.  Covid test is still pending, patient was educated on quarantine and return precautions if her results are positive.  We discussed strict return precautions to which the patient voiced understanding.  At this stage in the ED course, the patient has been medically screened and stable for discharge.  Final Clinical Impression(s) / ED Diagnoses Final diagnoses:  Mild intermittent asthma with acute exacerbation  Rx / DC Orders ED Discharge Orders         Ordered    predniSONE (DELTASONE) 20 MG tablet  Daily        09/02/20 0824    albuterol  (VENTOLIN HFA) 108 (90 Base) MCG/ACT inhaler  Every 4 hours PRN        09/02/20 0824           Mare Ferrari, PA-C 09/02/20 9629    Alvira Monday, MD 09/02/20 2134

## 2020-09-02 NOTE — Discharge Instructions (Addendum)
It was a pleasure treating you today.  Your work-up was overall reassuring, please continue to take your albuterol as needed.  You were also tested for COVID-19 today, your results will be available via MyChart.  Please make sure to quarantine until you receive your results.  If a are positive, please make sure to quarantine an additional 7 to 10 days.  Please take the course of steroids as directed until finished.  As discussed, please make sure you follow-up with your primary care doctor.  You would likely benefit from a further evaluation for sleep apnea.  Return to the ER if your symptoms worsen.

## 2020-09-02 NOTE — ED Notes (Signed)
Pt has asthma and has been without her inhaler for the past 2 wweks

## 2021-06-24 ENCOUNTER — Institutional Professional Consult (permissible substitution): Payer: Medicaid Other | Admitting: Neurology

## 2021-06-24 ENCOUNTER — Encounter: Payer: Self-pay | Admitting: Neurology

## 2021-07-02 ENCOUNTER — Institutional Professional Consult (permissible substitution): Payer: Medicaid Other | Admitting: Neurology

## 2022-03-02 ENCOUNTER — Encounter (HOSPITAL_COMMUNITY): Payer: Self-pay

## 2022-03-02 ENCOUNTER — Emergency Department (HOSPITAL_COMMUNITY)
Admission: EM | Admit: 2022-03-02 | Discharge: 2022-03-03 | Disposition: A | Discharge status: Home / Self Care | Payer: Medicaid Other | Attending: Emergency Medicine | Admitting: Emergency Medicine

## 2022-03-02 ENCOUNTER — Other Ambulatory Visit: Payer: Self-pay

## 2022-03-02 DIAGNOSIS — R5383 Other fatigue: Secondary | ICD-10-CM | POA: Insufficient documentation

## 2022-03-02 DIAGNOSIS — R7989 Other specified abnormal findings of blood chemistry: Secondary | ICD-10-CM | POA: Diagnosis present

## 2022-03-02 LAB — TYPE AND SCREEN
ABO/RH(D): A POS
Antibody Screen: NEGATIVE

## 2022-03-02 LAB — COMPREHENSIVE METABOLIC PANEL
ALT: 21 U/L (ref 0–44)
AST: 24 U/L (ref 15–41)
Albumin: 3.7 g/dL (ref 3.5–5.0)
Alkaline Phosphatase: 71 U/L (ref 38–126)
Anion gap: 8 (ref 5–15)
BUN: 14 mg/dL (ref 6–20)
CO2: 26 mmol/L (ref 22–32)
Calcium: 9.3 mg/dL (ref 8.9–10.3)
Chloride: 107 mmol/L (ref 98–111)
Creatinine, Ser: 0.69 mg/dL (ref 0.44–1.00)
GFR, Estimated: >60 mL/min (ref >60)
Glucose, Bld: 101 mg/dL — ABNORMAL HIGH (ref 70–99)
Potassium: 4.2 mmol/L (ref 3.5–5.1)
Sodium: 141 mmol/L (ref 135–145)
Total Bilirubin: 0.5 mg/dL (ref 0.3–1.2)
Total Protein: 6.6 g/dL (ref 6.5–8.1)

## 2022-03-02 LAB — DIFFERENTIAL
Abs Immature Granulocytes: 0.02 10*3/uL (ref 0.00–0.07)
Basophils Absolute: 0.1 10*3/uL (ref 0.0–0.1)
Basophils Relative: 1 %
Eosinophils Absolute: 0.1 10*3/uL (ref 0.0–0.5)
Eosinophils Relative: 1 %
Immature Granulocytes: 0 %
Lymphocytes Relative: 27 %
Lymphs Abs: 2.1 10*3/uL (ref 0.7–4.0)
Monocytes Absolute: 0.4 10*3/uL (ref 0.1–1.0)
Monocytes Relative: 5 %
Neutro Abs: 5.2 10*3/uL (ref 1.7–7.7)
Neutrophils Relative %: 66 %

## 2022-03-02 LAB — CBC
HCT: 41.3 % (ref 36.0–46.0)
Hemoglobin: 13 g/dL (ref 12.0–15.0)
MCH: 26.2 pg (ref 26.0–34.0)
MCHC: 31.5 g/dL (ref 30.0–36.0)
MCV: 83.3 fL (ref 80.0–100.0)
Platelets: 291 10*3/uL (ref 150–400)
RBC: 4.96 MIL/uL (ref 3.87–5.11)
RDW: 14.6 % (ref 11.5–15.5)
WBC: 7.8 10*3/uL (ref 4.0–10.5)
nRBC: 0 % (ref 0.0–0.2)

## 2022-03-02 LAB — I-STAT BETA HCG BLOOD, ED (MC, WL, AP ONLY): I-stat hCG, quantitative: <5 m[IU]/mL (ref <5)

## 2022-03-02 NOTE — ED Provider Triage Note (Signed)
Emergency Medicine Provider Triage Evaluation Note ? ?Catherine Edwards , a 46 y.o. female  was evaluated in triage.  Pt complains of abnormal lab. States that went to her PCP for routine evaluation and was told that her platelet count was critically low and was subsequently sent here for evaluation.  She has a piece of scratch paper that says that her WBC count was 2.2, platelets 24, and neutrophils 400. Denies any history of same, states that she has been feeling generally unwell for the past several weeks with increasing fatigue but thought that it was due to her recent diagnosis of sleep apnea.  Denies any specific recent illness or rashes.  States that she has felt nauseous lately without any vomiting or diarrhea. ? ?Review of Systems  ?Positive:  ?Negative: See above ? ?Physical Exam  ?BP (!) 145/91 (BP Location: Right Arm)   Pulse (!) 104   Temp 98.9 ?F (37.2 ?C) (Oral)   Resp 20   Ht 5\' 5"  (1.651 m)   Wt 121.1 kg   SpO2 97%   BMI 44.43 kg/m?  ?Gen:   Awake, no distress  ?Resp:  Normal effort  ?MSK:   Moves extremities without difficulty  ?Other:   ? ?Medical Decision Making  ?Medically screening exam initiated at 6:24 PM.  Appropriate orders placed.  Nahara Dona was informed that the remainder of the evaluation will be completed by another provider, this initial triage assessment does not replace that evaluation, and the importance of remaining in the ED until their evaluation is complete. ? ? ?  ?Teodoro Spray, PA-C ?03/02/22 1831 ? ?

## 2022-03-02 NOTE — ED Provider Notes (Signed)
?[facility] ?Provider Note ? ? ?CSN: [id number] ?Arrival date & time: 03/02/22  1739 ? ?  ? ?History ? ?Chief Complaint  ?Patient presents with  ? Abnormal Lab  ? ? ?Catherine SprayVanessa Molinaro is a 46 y.o. female. ? ?Patient who is currently being evaluated for sleep apnea presents emergency department for evaluation of abnormal blood work results.  Patient saw her PCP clinic yesterday.  She had labs drawn which were abnormal.  Abnormal labs included white blood cell count, neutrophils and platelets.  She had this rechecked today and these were reportedly consistent.  She was encouraged to come to the emergency department.  Per her report WBC count 2.2, ANC 400, platelets 24.  Patient reports daytime sleepiness and excessive sleepiness.  She has had a couple of bruises that are now resolved.  She has chronic gingivitis per her report and has not had any change in gum bleeding.  No blood noted in the stool or urine.  No chest pain or shortness of breath. ? ? ?  ? ?Home Medications ?Prior to Admission medications   ?Medication Sig Start Date End Date Taking? Authorizing Provider  ?albuterol (VENTOLIN HFA) 108 (90 Base) MCG/ACT inhaler Inhale 2 puffs into the lungs every 4 (four) hours as needed for wheezing or shortness of breath. Wheezing or shortness of breath 09/02/20   Mare FerrariBelaya, Maria A, PA-C  ?budesonide-formoterol [facility](SYMBICORT) 80-4.5 MCG/ACT inhaler  04/21/21   [provider]  ?   ? ?Allergies    ?Patient has no known allergies.   ? ?Review of Systems   ?Review of Systems ? ?Physical Exam ?Updated Vital Signs ?BP 121/65   Pulse 95   Temp 98.9 ?F (37.2 ?C) (Oral)   Resp 16   Ht 5\' 5"  (1.651 m)   Wt 121.1 kg   SpO2 99%   BMI 44.43 kg/m?  ?Physical Exam ?Vitals and nursing note reviewed.  ?Constitutional:   ?   General: She is not in acute distress. ?   Appearance: She is well-developed.  ?HENT:  ?   Head: Normocephalic and atraumatic.  ?   Right Ear: External ear normal.  ?    Left Ear: External ear normal.  ?   Nose: Nose normal.  ?Eyes:  ?   Conjunctiva/sclera: Conjunctivae normal.  ?   Comments: Normal conjunctiva.  ?Cardiovascular:  ?   Rate and Rhythm: Normal rate and regular rhythm.  ?   Heart sounds: No murmur heard. ?Pulmonary:  ?   Effort: No respiratory distress.  ?   Breath sounds: No wheezing, rhonchi or rales.  ?Abdominal:  ?   Palpations: Abdomen is soft.  ?   Tenderness: There is no abdominal tenderness. There is no guarding or rebound.  ?Musculoskeletal:  ?   Cervical back: Normal range of motion and neck supple.  ?   Right lower leg: No edema.  ?   Left lower leg: No edema.  ?Skin: ?   General: Skin is warm and dry.  ?   Findings: No rash.  ?   Comments: No obvious bruising noted.  ?Neurological:  ?   General: No focal deficit present.  ?   Mental Status: She is alert. Mental status is at baseline.  ?   Motor: No weakness.  ?Psychiatric:     ?   Mood and Affect: Mood normal.  ? ? ?ED Results / Procedures / Treatments   ?Labs ?(all labs ordered are listed, but only abnormal results are displayed) ?  Labs Reviewed  ?COMPREHENSIVE METABOLIC PANEL - Abnormal; Notable for the following components:  ?    Result Value  ? Glucose, Bld 101 (*)   ? All other components within normal limits  ?CBC  ?DIFFERENTIAL  ?CBC WITH DIFFERENTIAL/PLATELET  ?I-STAT BETA HCG BLOOD, ED ([facility], [facility], [facility] ONLY)  ?POC OCCULT BLOOD, ED  ?TYPE AND SCREEN  ? ? ?EKG ?EKG Interpretation ? ?Date/Time:  Tuesday March 02 2022 18:07:51 EDT ?Ventricular Rate:  95 ?PR Interval:  148 ?QRS Duration: 72 ?QT Interval:  354 ?QTC Calculation: 444 ?R Axis:   16 ?Text Interpretation: Normal sinus rhythm Normal ECG When compared with ECG of 02-Sep-2020 08:15, PREVIOUS ECG IS PRESENT Confirmed by Kennis Carina [id number]) on 03/02/2022 11:45:55 PM ? ?Radiology ?No results found. ? ?Procedures ?Procedures  ? ? ?Medications Ordered in ED ?Medications - No data to display ? ?ED Course/ Medical Decision Making/ A&P ?  ? ?Patient seen and  examined. History obtained directly from patient. Work-up including labs, imaging, EKG ordered in triage, if performed, were reviewed.   ? ?Labs/EKG: Independently reviewed and interpreted.  This included: Negative pregnancy; CBC with normal white cell count, red cell count, platelets; CMP normal without signs of hemolysis. ? ?Imaging: None ordered ? ?Medications/Fluids: None ordered ? ?Most recent vital signs reviewed and are as follows: ?BP 121/65   Pulse 95   Temp 98.9 ?F (37.2 ?C) (Oral)   Resp 16   Ht 5\' 5"  (1.651 m)   Wt 121.1 kg   SpO2 99%   BMI 44.43 kg/m?  ? ?Initial impression: Question of abnormal lab work.  Obviously one of the lab draws are incorrect.  Will recheck a CBC here.  ? ?1:07 AM Reassessment performed. Patient appears stable. ? ?Labs personally reviewed and interpreted including: Normal WBC, ANC, platelet count on redraw. ? ?Reviewed pertinent lab work and imaging with patient at bedside. Questions answered.  ? ?Most current vital signs reviewed and are as follows: ?BP 116/87   Pulse 95   Temp 98.9 ?F (37.2 ?C) (Oral)   Resp (!) 22   Ht 5\' 5"  (1.651 m)   Wt 121.1 kg   SpO2 97%   BMI 44.43 kg/m?  ? ?Plan: Discharge to home.  ? ?ED return instructions discussed: As needed for any concerns ? ?Follow-up instructions discussed: Patient encouraged to follow-up with their PCP as planned for continued evaluation of symptoms.  It sounds like she will need continued work-up for OSA. ? ? ?                        ?Medical Decision Making ?Amount and/or Complexity of Data Reviewed ?Labs: ordered. ? ? ?Patient presents to the emergency department after having abnormal labs drawn by her outpatient clinic.  After recheck in the ED x2, it appears that her labs are actually normal and reassuring.  No evidence of leukopenia, neutropenia, or thrombocytopenia.  Patient has been having chronic fatigue and some signs of sleep apnea.  Hemoglobin is normal today.  She appears to be stable.  No indication  for further evaluation.  She is comfortable with discharge. ? ?The patient's vital signs, pertinent lab work and imaging were reviewed and interpreted as discussed in the ED course. Hospitalization was considered for further testing, treatments, or serial exams/observation. However as patient is well-appearing, has a stable exam, and reassuring studies today, I do not feel that they warrant admission at this time. This plan was discussed with the patient  who verbalizes agreement and comfort with this plan and seems reliable and able to return to the Emergency Department with worsening or changing symptoms.  ? ? ? ? ? ? ? ? ? ?Final Clinical Impression(s) / ED Diagnoses ?Final diagnoses:  ?Other fatigue  ? ? ?Rx / DC Orders ?ED Discharge Orders   ? ? None  ? ?  ? ? ?  ?Renne Crigler, PA-C ?03/03/22 0109 ? ?  ?Sabas Sous, MD ?03/03/22 [phone] ? ?

## 2022-03-02 NOTE — ED Triage Notes (Signed)
Pt arrived via POV, sent by PCP for low platelets. Pt wrote the bw results and WBC 2.2, platelet 24 and nuetraphil 400. Pt c/o SOB, fatigue, dizziness, nauseax6-8 mos ?

## 2022-03-03 LAB — CBC WITH DIFFERENTIAL/PLATELET
Abs Immature Granulocytes: 0.02 10*3/uL (ref 0.00–0.07)
Basophils Absolute: 0.1 10*3/uL (ref 0.0–0.1)
Basophils Relative: 1 %
Eosinophils Absolute: 0.1 10*3/uL (ref 0.0–0.5)
Eosinophils Relative: 1 %
HCT: 41 % (ref 36.0–46.0)
Hemoglobin: 12.6 g/dL (ref 12.0–15.0)
Immature Granulocytes: 0 %
Lymphocytes Relative: 29 %
Lymphs Abs: 2.2 10*3/uL (ref 0.7–4.0)
MCH: 25.6 pg — ABNORMAL LOW (ref 26.0–34.0)
MCHC: 30.7 g/dL (ref 30.0–36.0)
MCV: 83.3 fL (ref 80.0–100.0)
Monocytes Absolute: 0.4 10*3/uL (ref 0.1–1.0)
Monocytes Relative: 6 %
Neutro Abs: 4.9 10*3/uL (ref 1.7–7.7)
Neutrophils Relative %: 63 %
Platelets: 281 10*3/uL (ref 150–400)
RBC: 4.92 MIL/uL (ref 3.87–5.11)
RDW: 14.6 % (ref 11.5–15.5)
WBC: 7.6 10*3/uL (ref 4.0–10.5)
nRBC: 0 % (ref 0.0–0.2)

## 2022-03-03 NOTE — Discharge Instructions (Signed)
Your blood cell counts were reassuring and normal here today on both checks.  The outpatient labs were likely an error, but you will need to continue follow-up with them regarding your chronic medical issues and fatigue. ?

## 2022-04-15 ENCOUNTER — Telehealth: Payer: Self-pay

## 2022-04-15 NOTE — Telephone Encounter (Signed)
This patient's referral for a sleep consultation has expired. If the patient would still like to be seen they will need to have a new referral placed to our office to be reviewed.  ? ?

## 2024-01-25 ENCOUNTER — Encounter (HOSPITAL_COMMUNITY): Payer: Self-pay

## 2024-01-25 ENCOUNTER — Emergency Department (HOSPITAL_COMMUNITY): Payer: Medicaid Other

## 2024-01-25 ENCOUNTER — Other Ambulatory Visit: Payer: Self-pay

## 2024-01-25 ENCOUNTER — Emergency Department (HOSPITAL_COMMUNITY)
Admission: EM | Admit: 2024-01-25 | Discharge: 2024-01-25 | Discharge status: Against Medical Advice | Payer: Medicaid Other | Attending: Emergency Medicine | Admitting: Emergency Medicine

## 2024-01-25 DIAGNOSIS — Z5321 Procedure and treatment not carried out due to patient leaving prior to being seen by health care provider: Secondary | ICD-10-CM | POA: Diagnosis not present

## 2024-01-25 DIAGNOSIS — J45909 Unspecified asthma, uncomplicated: Secondary | ICD-10-CM | POA: Insufficient documentation

## 2024-01-25 DIAGNOSIS — R0789 Other chest pain: Secondary | ICD-10-CM | POA: Diagnosis not present

## 2024-01-25 DIAGNOSIS — R079 Chest pain, unspecified: Secondary | ICD-10-CM | POA: Diagnosis present

## 2024-01-25 LAB — CBC WITH DIFFERENTIAL/PLATELET
Abs Immature Granulocytes: 0.17 10*3/uL — ABNORMAL HIGH (ref 0.00–0.07)
Basophils Absolute: 0 10*3/uL (ref 0.0–0.1)
Basophils Relative: 0 %
Eosinophils Absolute: 0 10*3/uL (ref 0.0–0.5)
Eosinophils Relative: 0 %
HCT: 39.5 % (ref 36.0–46.0)
Hemoglobin: 11.7 g/dL — ABNORMAL LOW (ref 12.0–15.0)
Immature Granulocytes: 1 %
Lymphocytes Relative: 23 %
Lymphs Abs: 3.2 10*3/uL (ref 0.7–4.0)
MCH: 21.7 pg — ABNORMAL LOW (ref 26.0–34.0)
MCHC: 29.6 g/dL — ABNORMAL LOW (ref 30.0–36.0)
MCV: 73.3 fL — ABNORMAL LOW (ref 80.0–100.0)
Monocytes Absolute: 0.7 10*3/uL (ref 0.1–1.0)
Monocytes Relative: 5 %
Neutro Abs: 10.2 10*3/uL — ABNORMAL HIGH (ref 1.7–7.7)
Neutrophils Relative %: 71 %
Platelets: 373 10*3/uL (ref 150–400)
RBC: 5.39 MIL/uL — ABNORMAL HIGH (ref 3.87–5.11)
RDW: 16.6 % — ABNORMAL HIGH (ref 11.5–15.5)
WBC: 14.4 10*3/uL — ABNORMAL HIGH (ref 4.0–10.5)
nRBC: 0 % (ref 0.0–0.2)

## 2024-01-25 LAB — BASIC METABOLIC PANEL
Anion gap: 9 (ref 5–15)
BUN: 12 mg/dL (ref 6–20)
CO2: 23 mmol/L (ref 22–32)
Calcium: 9.1 mg/dL (ref 8.9–10.3)
Chloride: 104 mmol/L (ref 98–111)
Creatinine, Ser: 0.75 mg/dL (ref 0.44–1.00)
GFR, Estimated: >60 mL/min (ref >60)
Glucose, Bld: 110 mg/dL — ABNORMAL HIGH (ref 70–99)
Potassium: 4.1 mmol/L (ref 3.5–5.1)
Sodium: 136 mmol/L (ref 135–145)

## 2024-01-25 NOTE — ED Notes (Signed)
Pt called 3x no response

## 2024-01-25 NOTE — ED Provider Triage Note (Signed)
Emergency Medicine Provider Triage Evaluation Note  Catherine Edwards , a 48 y.o. female  was evaluated in triage.  Pt complains of chest tightness x 1 week.  History of asthma, currently taking Symbicort, albuterol inhaler, and prednisone taper.  Review of Systems  Positive: Dry cough, congestion, sinus pressure, chest tightness, shortness of breath Negative: Fever, chills, nausea, vomiting, diarrhea, sore throat, abdominal pain  Physical Exam  Ht 5\' 4"  (1.626 m)   Wt 134.7 kg   BMI 50.98 kg/m  Gen:   Awake, no distress   Resp:  Minimally increased effort MSK:   Moves extremities without difficulty  Other:    Medical Decision Making  Medically screening exam initiated at 2:14 PM.  Appropriate orders placed.  Catherine Edwards was informed that the remainder of the evaluation will be completed by another provider, this initial triage assessment does not replace that evaluation, and the importance of remaining in the ED until their evaluation is complete.  Labs and imaging ordered   Gretta Began 01/25/24 1415

## 2024-01-25 NOTE — ED Triage Notes (Signed)
Pt here for [facility] 8 days ago and got prescribed prednisone/ albuterol and meds do not help. C/O chest pain in upper chest. Hx of bronchitis. Denies swelling.

## 2024-01-25 NOTE — ED Notes (Signed)
The pt has not answered since 1430

## 2024-01-25 NOTE — ED Notes (Signed)
Waiting on pediatric side with children
